# Patient Record
Sex: Female | Born: 1990 | Race: Black or African American | Hispanic: No | Marital: Single | Smoking: Never smoker
Health system: Southern US, Community
[De-identification: ages and names within clinical notes are randomized; demographics above are authoritative.]

## PROBLEM LIST (undated history)

## (undated) ENCOUNTER — Inpatient Hospital Stay (HOSPITAL_COMMUNITY): Payer: Self-pay

## (undated) DIAGNOSIS — R87629 Unspecified abnormal cytological findings in specimens from vagina: Secondary | ICD-10-CM

## (undated) DIAGNOSIS — F419 Anxiety disorder, unspecified: Secondary | ICD-10-CM

## (undated) DIAGNOSIS — F32A Depression, unspecified: Secondary | ICD-10-CM

## (undated) DIAGNOSIS — F329 Major depressive disorder, single episode, unspecified: Secondary | ICD-10-CM

## (undated) DIAGNOSIS — B009 Herpesviral infection, unspecified: Secondary | ICD-10-CM

## (undated) DIAGNOSIS — F4310 Post-traumatic stress disorder, unspecified: Secondary | ICD-10-CM

## (undated) HISTORY — DX: Major depressive disorder, single episode, unspecified: F32.9

## (undated) HISTORY — PX: NO PAST SURGERIES: SHX2092

## (undated) HISTORY — DX: Herpesviral infection, unspecified: B00.9

## (undated) HISTORY — DX: Anxiety disorder, unspecified: F41.9

## (undated) HISTORY — DX: Depression, unspecified: F32.A

## (undated) HISTORY — DX: Post-traumatic stress disorder, unspecified: F43.10

## (undated) HISTORY — DX: Unspecified abnormal cytological findings in specimens from vagina: R87.629

---

## 2017-11-09 NOTE — L&D Delivery Note (Addendum)
Patient: Brooke Briggs MRN: [id number]  GBS status: positive, IAP given: penicillin  Patient is a 27 y.o. now G2P2002 s/p NSVD at 2461w3d, who was admitted for PROM. SROM 6h 388m prior to delivery with clear fluid.   Delivery Note At 7:13 PM a viable female was delivered via Vaginal, Spontaneous (Presentation: OA).  APGAR: pending in delivery summary, infant vigorous at delivery; weight pending.   Placenta status: intact. Cord: three vessel with the following complications: none.  Anesthesia: IV pain medication Episiotomy: None Lacerations: None Suture Repair: NA Est. Blood Loss (mL): 504  Mom to postpartum.  Baby to Couplet care / Skin to Skin.  Brooke Briggs 10/25/2018, 7:38 PM  Head delivered OA. A nuchal cord present and was not reduced prior to delivery. Shoulder and body delivered in usual fashion. Infant with spontaneous cry, placed on mother's abdomen, dried and bulb suctioned. Cord clamped x 2 after 1-minute delay, and cut by family member. Cord blood drawn. Placenta delivered spontaneously with gentle cord traction. Fundus firm with massage and Pitocin. Perineum inspected and found to have no laceration, which was found to be hemostatic.  Attestation: I have seen this patient and agree with the resident's documentation. I have examined them separately, and we have discussed the plan of care.  Brooke DeerLaurel S. Earlene PlaterWallace, DO OB/GYN Fellow

## 2018-05-19 ENCOUNTER — Encounter: Payer: Medicaid Other | Admitting: Obstetrics & Gynecology

## 2018-05-30 ENCOUNTER — Ambulatory Visit (INDEPENDENT_AMBULATORY_CARE_PROVIDER_SITE_OTHER): Payer: Medicaid Other | Admitting: Obstetrics & Gynecology

## 2018-05-30 ENCOUNTER — Encounter: Payer: Self-pay | Admitting: Obstetrics & Gynecology

## 2018-05-30 DIAGNOSIS — Z3482 Encounter for supervision of other normal pregnancy, second trimester: Secondary | ICD-10-CM

## 2018-05-30 DIAGNOSIS — Z3480 Encounter for supervision of other normal pregnancy, unspecified trimester: Secondary | ICD-10-CM | POA: Insufficient documentation

## 2018-05-30 MED ORDER — PROMETHAZINE HCL 25 MG PO TABS: 25.0000 mg | ORAL_TABLET | Freq: Four times a day (QID) | ORAL | 2 refills | Status: DC | PRN

## 2018-05-30 NOTE — Addendum Note (Signed)
Addended by: Granville LewisLARK, Drelyn Pistilli L on: 05/30/2018 02:34 PM   Modules accepted: Orders

## 2018-05-30 NOTE — Progress Notes (Signed)
  Subjective:    Brooke Briggs is being seen today for her first obstetrical visit.  This is a planned pregnancy. She is at 4583w2d gestation. Her obstetrical history is significant for none. Relationship with FOB: not married, not living together at this time. Patient does intend to breast feed. Pregnancy history fully reviewed.  Patient reports vomiting.  Review of Systems:   Review of Systems  Objective:     BP 130/83   Pulse 80   Ht 5\' 4"  (1.626 m)   Wt 139 lb (63 kg)   LMP 01/29/2018   BMI 23.86 kg/m  Physical Exam  Exam  Breathing, conversing, and ambulating normally Heart- rrr Lungs- CTAB Abd- benign, gravid FHR- 157    Assessment:    Pregnancy: G2P1001 Patient Active Problem List   Diagnosis Date Noted  . Supervision of other normal pregnancy, antepartum 05/30/2018       Plan:     Initial labs drawn. Prenatal vitamins. Problem list reviewed and updated. AFP today Role of ultrasound in pregnancy discussed; fetal survey: ordered.  Follow up in 4 weeks.   Phenergan prn Allie BossierMyra C Ether Goebel 05/30/2018

## 2018-05-31 LAB — ALPHA FETOPROTEIN, MATERNAL
AFP MOM: 1.15
AFP, SERUM: 54.8 ng/mL
CALC'D GESTATIONAL AGE: 17.3 wk
MATERNAL WT: 132 [lb_av]
Risk for ONTD: <1
Twins-AFP: 1

## 2018-06-07 ENCOUNTER — Encounter (HOSPITAL_COMMUNITY): Payer: Self-pay

## 2018-06-14 ENCOUNTER — Ambulatory Visit (HOSPITAL_COMMUNITY)
Admission: RE | Admit: 2018-06-14 | Discharge: 2018-06-14 | Disposition: A | Discharge status: Home / Self Care | Payer: Medicaid Other | Source: Ambulatory Visit | Attending: Obstetrics & Gynecology | Admitting: Obstetrics & Gynecology

## 2018-06-14 ENCOUNTER — Other Ambulatory Visit: Payer: Self-pay | Admitting: Obstetrics & Gynecology

## 2018-06-14 ENCOUNTER — Other Ambulatory Visit (HOSPITAL_COMMUNITY): Payer: Self-pay | Admitting: *Deleted

## 2018-06-14 DIAGNOSIS — IMO0002 Reserved for concepts with insufficient information to code with codable children: Secondary | ICD-10-CM

## 2018-06-14 DIAGNOSIS — Z3A19 19 weeks gestation of pregnancy: Secondary | ICD-10-CM | POA: Diagnosis not present

## 2018-06-14 DIAGNOSIS — Q210 Ventricular septal defect: Secondary | ICD-10-CM

## 2018-06-14 DIAGNOSIS — O350XX0 Maternal care for (suspected) central nervous system malformation in fetus, not applicable or unspecified: Secondary | ICD-10-CM

## 2018-06-14 DIAGNOSIS — Z363 Encounter for antenatal screening for malformations: Secondary | ICD-10-CM

## 2018-06-14 DIAGNOSIS — Z3480 Encounter for supervision of other normal pregnancy, unspecified trimester: Secondary | ICD-10-CM

## 2018-06-14 DIAGNOSIS — Z362 Encounter for other antenatal screening follow-up: Secondary | ICD-10-CM

## 2018-06-14 DIAGNOSIS — O3506X0 Maternal care for (suspected) central nervous system malformation or damage in fetus, hydrocephaly, not applicable or unspecified: Secondary | ICD-10-CM

## 2018-06-27 ENCOUNTER — Ambulatory Visit (INDEPENDENT_AMBULATORY_CARE_PROVIDER_SITE_OTHER): Payer: Medicaid Other | Admitting: Obstetrics & Gynecology

## 2018-06-27 VITALS — BP 120/88 | HR 96 | Wt 146.0 lb

## 2018-06-27 DIAGNOSIS — Z3480 Encounter for supervision of other normal pregnancy, unspecified trimester: Secondary | ICD-10-CM

## 2018-06-27 NOTE — Progress Notes (Signed)
   PRENATAL VISIT NOTE  Subjective:  Brooke Briggs is a 27 y.o. G2P1001 at 7527w2d being seen today for ongoing prenatal care.  She is currently monitored for the following issues for this low-risk pregnancy and has Supervision of other normal pregnancy, antepartum on their problem list.  Patient reports no complaints.  Contractions: Not present. Vag. Bleeding: None.  Movement: Present. Denies leaking of fluid.   The following portions of the patient's history were reviewed and updated as appropriate: allergies, current medications, past family history, past medical history, past social history, past surgical history and problem list. Problem list updated.  Objective:   Vitals:   06/27/18 1530  BP: 120/88  Pulse: 96  Weight: 146 lb (66.2 kg)    Fetal Status: Fetal Heart Rate (bpm): 148 Fundal Height: 22 cm Movement: Present     General:  Alert, oriented and cooperative. Patient is in no acute distress.  Skin: Skin is warm and dry. No rash noted.   Cardiovascular: Normal heart rate noted  Respiratory: Normal respiratory effort, no problems with respiration noted  Abdomen: Soft, gravid, appropriate for gestational age.  Pain/Pressure: Absent     Pelvic: Cervical exam deferred        Extremities: Normal range of motion.  Edema: None  Mental Status: Normal mood and affect. Normal behavior. Normal judgment and thought content.   Assessment and Plan:  Pregnancy: G2P1001 at 6427w2d  1. Supervision of other normal pregnancy, antepartum - follow up MFM u/s next month  Preterm labor symptoms and general obstetric precautions including but not limited to vaginal bleeding, contractions, leaking of fluid and fetal movement were reviewed in detail with the patient. Please refer to After Visit Summary for other counseling recommendations.  Return in about 4 weeks (around 07/25/2018).  Future Appointments  Date Time Provider Department Center  07/12/2018 12:45 PM [facility] US 2 [facility]     Allie BossierMyra C Chennel Olivos, MD

## 2018-07-12 ENCOUNTER — Ambulatory Visit (HOSPITAL_COMMUNITY)
Admission: RE | Admit: 2018-07-12 | Discharge: 2018-07-12 | Disposition: A | Discharge status: Home / Self Care | Payer: Medicaid Other | Source: Ambulatory Visit | Attending: Obstetrics & Gynecology | Admitting: Obstetrics & Gynecology

## 2018-07-12 DIAGNOSIS — Z3A23 23 weeks gestation of pregnancy: Secondary | ICD-10-CM | POA: Diagnosis not present

## 2018-07-12 DIAGNOSIS — O359XX0 Maternal care for (suspected) fetal abnormality and damage, unspecified, not applicable or unspecified: Secondary | ICD-10-CM

## 2018-07-12 DIAGNOSIS — Z362 Encounter for other antenatal screening follow-up: Secondary | ICD-10-CM

## 2018-07-25 ENCOUNTER — Other Ambulatory Visit (HOSPITAL_COMMUNITY)
Admission: RE | Admit: 2018-07-25 | Discharge: 2018-07-25 | Disposition: A | Discharge status: Home / Self Care | Payer: Medicaid Other | Source: Ambulatory Visit | Attending: Obstetrics & Gynecology | Admitting: Obstetrics & Gynecology

## 2018-07-25 ENCOUNTER — Ambulatory Visit (INDEPENDENT_AMBULATORY_CARE_PROVIDER_SITE_OTHER): Payer: Medicaid Other | Admitting: Obstetrics & Gynecology

## 2018-07-25 VITALS — BP 130/79 | HR 92 | Wt 152.0 lb

## 2018-07-25 DIAGNOSIS — O26899 Other specified pregnancy related conditions, unspecified trimester: Secondary | ICD-10-CM

## 2018-07-25 DIAGNOSIS — N898 Other specified noninflammatory disorders of vagina: Secondary | ICD-10-CM | POA: Diagnosis not present

## 2018-07-25 DIAGNOSIS — Z3A25 25 weeks gestation of pregnancy: Secondary | ICD-10-CM | POA: Diagnosis not present

## 2018-07-25 DIAGNOSIS — Z3482 Encounter for supervision of other normal pregnancy, second trimester: Secondary | ICD-10-CM

## 2018-07-25 DIAGNOSIS — O26892 Other specified pregnancy related conditions, second trimester: Secondary | ICD-10-CM | POA: Insufficient documentation

## 2018-07-25 DIAGNOSIS — Z3480 Encounter for supervision of other normal pregnancy, unspecified trimester: Secondary | ICD-10-CM

## 2018-07-25 LAB — POCT URINALYSIS DIPSTICK
Bilirubin, UA: NEGATIVE
Glucose, UA: NEGATIVE
Ketones, UA: NEGATIVE
NITRITE UA: NEGATIVE
PH UA: 7.5 (ref 5.0–8.0)
PROTEIN UA: POSITIVE — AB
Spec Grav, UA: 1.01 (ref 1.010–1.025)
UROBILINOGEN UA: NEGATIVE U/dL — AB

## 2018-07-25 NOTE — Progress Notes (Signed)
Error

## 2018-07-25 NOTE — Progress Notes (Signed)
   PRENATAL VISIT NOTE  Subjective:  Brooke Briggs is a 27 y.o. G2P1001 at 3670w2d being seen today for ongoing prenatal care.  She is currently monitored for the following issues for this low-risk pregnancy and has Supervision of other normal pregnancy, antepartum on their problem list.  Patient reports vaginal irritation.  Contractions: Not present. Vag. Bleeding: None.  Movement: Present. Denies leaking of fluid.   The following portions of the patient's history were reviewed and updated as appropriate: allergies, current medications, past family history, past medical history, past social history, past surgical history and problem list. Problem list updated.  Objective:   Vitals:   07/25/18 1511  BP: 130/79  Pulse: 92  Weight: 152 lb (68.9 kg)    Fetal Status: Fetal Heart Rate (bpm): 153 Fundal Height: 26 cm Movement: Present     General:  Alert, oriented and cooperative. Patient is in no acute distress.  Skin: Skin is warm and dry. No rash noted.   Cardiovascular: Normal heart rate noted  Respiratory: Normal respiratory effort, no problems with respiration noted  Abdomen: Soft, gravid, appropriate for gestational age.  Pain/Pressure: Absent     Pelvic: Cervical exam deferred      , speculum exam reveals a normal discharge  Extremities: Normal range of motion.  Edema: None  Mental Status: Normal mood and affect. Normal behavior. Normal judgment and thought content.   Assessment and Plan:  Pregnancy: G2P1001 at 3170w2d  1. Supervision of other normal pregnancy, antepartum   2. Vaginal discharge during pregnancy, antepartum  - Cervicovaginal ancillary only - POCT Urinalysis Dipstick - Urine Culture  Preterm labor symptoms and general obstetric precautions including but not limited to vaginal bleeding, contractions, leaking of fluid and fetal movement were reviewed in detail with the patient. Please refer to After Visit Summary for other counseling recommendations.  No  follow-ups on file.  No future appointments.  Allie BossierMyra C Tyheim Vanalstyne, MD

## 2018-07-26 ENCOUNTER — Other Ambulatory Visit: Payer: Self-pay | Admitting: Obstetrics & Gynecology

## 2018-07-26 LAB — CERVICOVAGINAL ANCILLARY ONLY
Bacterial vaginitis: NEGATIVE
CANDIDA VAGINITIS: NEGATIVE

## 2018-07-26 MED ORDER — NITROFURANTOIN MONOHYD MACRO 100 MG PO CAPS: 100.0000 mg | ORAL_CAPSULE | Freq: Two times a day (BID) | ORAL | 1 refills | Status: DC

## 2018-07-26 NOTE — Progress Notes (Signed)
macrobid called in for probable UTI

## 2018-07-28 LAB — URINE CULTURE

## 2018-08-01 ENCOUNTER — Other Ambulatory Visit: Payer: Self-pay | Admitting: Obstetrics & Gynecology

## 2018-08-01 MED ORDER — SULFAMETHOXAZOLE-TRIMETHOPRIM 800-160 MG PO TABS: 1.0000 | ORAL_TABLET | Freq: Two times a day (BID) | ORAL | 0 refills | Status: DC

## 2018-08-01 NOTE — Progress Notes (Signed)
Bactrim called in for UTI Patient notified via Mychart

## 2018-08-15 ENCOUNTER — Ambulatory Visit (INDEPENDENT_AMBULATORY_CARE_PROVIDER_SITE_OTHER): Payer: Medicaid Other | Admitting: Obstetrics & Gynecology

## 2018-08-15 VITALS — BP 121/78 | HR 75 | Wt 155.0 lb

## 2018-08-15 DIAGNOSIS — Z3480 Encounter for supervision of other normal pregnancy, unspecified trimester: Secondary | ICD-10-CM

## 2018-08-15 DIAGNOSIS — Z3483 Encounter for supervision of other normal pregnancy, third trimester: Secondary | ICD-10-CM

## 2018-08-15 DIAGNOSIS — Z23 Encounter for immunization: Secondary | ICD-10-CM

## 2018-08-15 NOTE — Progress Notes (Signed)
   PRENATAL VISIT NOTE  Subjective:  Brooke Briggs is a 27 y.o. G2P1001 at [redacted]w[redacted]d being seen today for ongoing prenatal care.  She is currently monitored for the following issues for this low-risk pregnancy and has Supervision of other normal pregnancy, antepartum on their problem list.  Patient reports no complaints.  Contractions: Not present. Vag. Bleeding: None.  Movement: Present. Denies leaking of fluid.   The following portions of the patient's history were reviewed and updated as appropriate: allergies, current medications, past family history, past medical history, past social history, past surgical history and problem list. Problem list updated.  Objective:   Vitals:   08/15/18 1008  BP: 121/78  Pulse: 75  Weight: 155 lb (70.3 kg)    Fetal Status: Fetal Heart Rate (bpm): 151   Movement: Present     General:  Alert, oriented and cooperative. Patient is in no acute distress.  Skin: Skin is warm and dry. No rash noted.   Cardiovascular: Normal heart rate noted  Respiratory: Normal respiratory effort, no problems with respiration noted  Abdomen: Soft, gravid, appropriate for gestational age.  Pain/Pressure: Absent     Pelvic: Cervical exam deferred        Extremities: Normal range of motion.  Edema: None  Mental Status: Normal mood and affect. Normal behavior. Normal judgment and thought content.   Assessment and Plan:  Pregnancy: G2P1001 at [redacted]w[redacted]d  1. Supervision of other normal pregnancy, antepartum  - 2Hr GTT w/ 1 Hr Carpenter 75 g - HIV antibody (with reflex) - CBC - RPR - Tdap vaccine greater than or equal to 7yo IM  2. Needs flu shot  - Flu Vaccine QUAD 36+ mos IM  Preterm labor symptoms and general obstetric precautions including but not limited to vaginal bleeding, contractions, leaking of fluid and fetal movement were reviewed in detail with the patient. Please refer to After Visit Summary for other counseling recommendations.  No follow-ups on  file.  Future Appointments  Date Time Provider Department Center  08/30/2018  3:45 PM Rasch, Harolyn Rutherford, NP [facility]    Allie Bossier, MD

## 2018-08-16 LAB — CBC
HCT: 33.5 % — ABNORMAL LOW (ref 35.0–45.0)
HEMOGLOBIN: 11.2 g/dL — AB (ref 11.7–15.5)
MCH: 27.9 pg (ref 27.0–33.0)
MCHC: 33.4 g/dL (ref 32.0–36.0)
MCV: 83.5 fL (ref 80.0–100.0)
MPV: 12.1 fL (ref 7.5–12.5)
Platelets: 310 10*3/uL (ref 140–400)
RBC: 4.01 10*6/uL (ref 3.80–5.10)
RDW: 12.8 % (ref 11.0–15.0)
WBC: 13.7 10*3/uL — ABNORMAL HIGH (ref 3.8–10.8)

## 2018-08-16 LAB — 2HR GTT W 1 HR, CARPENTER, 75 G
GLUCOSE, FASTING, GEST: 72 mg/dL (ref 65–91)
Glucose, 1 Hr, Gest: 136 mg/dL (ref 65–179)
Glucose, 2 Hr, Gest: 97 mg/dL (ref 65–152)

## 2018-08-16 LAB — HIV ANTIBODY (ROUTINE TESTING W REFLEX): HIV: NONREACTIVE

## 2018-08-16 LAB — RPR: RPR: NONREACTIVE

## 2018-08-30 ENCOUNTER — Ambulatory Visit (INDEPENDENT_AMBULATORY_CARE_PROVIDER_SITE_OTHER): Payer: Medicaid Other | Admitting: Obstetrics and Gynecology

## 2018-08-30 DIAGNOSIS — Z3480 Encounter for supervision of other normal pregnancy, unspecified trimester: Secondary | ICD-10-CM

## 2018-08-30 NOTE — Progress Notes (Signed)
   PRENATAL VISIT NOTE  Subjective:  Brooke Briggs is a 27 y.o. G2P1001 at [redacted]w[redacted]d being seen today for ongoing prenatal care.  She is currently monitored for the following issues for this low-risk pregnancy and has Supervision of other normal pregnancy, antepartum on their problem list.  Patient reports no complaints.  Contractions: Not present. Vag. Bleeding: None.  Movement: Present. Denies leaking of fluid.   The following portions of the patient's history were reviewed and updated as appropriate: allergies, current medications, past family history, past medical history, past social history, past surgical history and problem list. Problem list updated.  Objective:   Vitals:   08/30/18 1547  BP: 119/76  Pulse: (!) 120  Weight: 157 lb (71.2 kg)    Fetal Status: Fetal Heart Rate (bpm): 156   Movement: Present     General:  Alert, oriented and cooperative. Patient is in no acute distress.  Skin: Skin is warm and dry. No rash noted.   Cardiovascular: Normal heart rate noted  Respiratory: Normal respiratory effort, no problems with respiration noted  Abdomen: Soft, gravid, appropriate for gestational age.  Pain/Pressure: Absent     Pelvic: Cervical exam deferred        Extremities: Normal range of motion.  Edema: None  Mental Status: Normal mood and affect. Normal behavior. Normal judgment and thought content.   Assessment and Plan:  Pregnancy: G2P1001 at [redacted]w[redacted]d  1. Supervision of other normal pregnancy, antepartum  - Doing well, 2 hour GTT normal   There are no diagnoses linked to this encounter. Preterm labor symptoms and general obstetric precautions including but not limited to vaginal bleeding, contractions, leaking of fluid and fetal movement were reviewed in detail with the patient. Please refer to After Visit Summary for other counseling recommendations.  Return in about 2 weeks (around 09/13/2018).  Future Appointments  Date Time Provider Department Center  09/13/2018   3:45 PM Leftwich-Kirby, Wilmer Floor, CNM [facility]    Venia Carbon, NP

## 2018-08-30 NOTE — Patient Instructions (Signed)

## 2018-09-13 ENCOUNTER — Ambulatory Visit (INDEPENDENT_AMBULATORY_CARE_PROVIDER_SITE_OTHER): Payer: Medicaid Other | Admitting: Advanced Practice Midwife

## 2018-09-13 VITALS — BP 115/74 | HR 110 | Wt 166.0 lb

## 2018-09-13 DIAGNOSIS — B009 Herpesviral infection, unspecified: Secondary | ICD-10-CM

## 2018-09-13 DIAGNOSIS — F329 Major depressive disorder, single episode, unspecified: Secondary | ICD-10-CM

## 2018-09-13 DIAGNOSIS — O98513 Other viral diseases complicating pregnancy, third trimester: Secondary | ICD-10-CM

## 2018-09-13 DIAGNOSIS — O99343 Other mental disorders complicating pregnancy, third trimester: Secondary | ICD-10-CM

## 2018-09-13 DIAGNOSIS — Z3480 Encounter for supervision of other normal pregnancy, unspecified trimester: Secondary | ICD-10-CM

## 2018-09-13 MED ORDER — VALACYCLOVIR HCL 500 MG PO TABS: 500.0000 mg | ORAL_TABLET | Freq: Two times a day (BID) | ORAL | 2 refills | Status: DC

## 2018-09-13 MED ORDER — SERTRALINE HCL 50 MG PO TABS: 50.0000 mg | ORAL_TABLET | Freq: Every day | ORAL | 3 refills | Status: DC

## 2018-09-13 NOTE — Progress Notes (Signed)
   PRENATAL VISIT NOTE  Subjective:  Brooke Briggs is a 27 y.o. G2P1001 at [redacted]w[redacted]d being seen today for ongoing prenatal care.  She is currently monitored for the following issues for this low-risk pregnancy and has Supervision of other normal pregnancy, antepartum on their problem list.  Patient reports occasional contractions and concerns about recurrent HSV and delivery, and concerns about depression and medications.  Contractions: Not present. Vag. Bleeding: None.  Movement: Present. Denies leaking of fluid.   The following portions of the patient's history were reviewed and updated as appropriate: allergies, current medications, past family history, past medical history, past social history, past surgical history and problem list. Problem list updated.  Objective:   Vitals:   09/13/18 1600  BP: 115/74  Pulse: (!) 110  Weight: 75.3 kg    Fetal Status: Fetal Heart Rate (bpm): 145   Movement: Present     General:  Alert, oriented and cooperative. Patient is in no acute distress.  Skin: Skin is warm and dry. No rash noted.   Cardiovascular: Normal heart rate noted  Respiratory: Normal respiratory effort, no problems with respiration noted  Abdomen: Soft, gravid, appropriate for gestational age.  Pain/Pressure: Absent     Pelvic: Cervical exam deferred        Extremities: Normal range of motion.  Edema: None  Mental Status: Normal mood and affect. Normal behavior. Normal judgment and thought content.   Assessment and Plan:  Pregnancy: G2P1001 at [redacted]w[redacted]d  1. Supervision of other normal pregnancy, antepartum --Anticipatory guidance about next visits/weeks of pregnancy given.  2. 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 Low risk, pt reports significant improvement in depression since starting med.  Will continue and take PP. She had PP depression with her son, not on medications so hopes this will help. --Recommend  counseling, discussed referral to [facility] here at [facility].  No referral made today. --Renewed Rx for Zoloft started by previous OB provider in [place]. - sertraline (ZOLOFT) 50 MG tablet; Take 1 tablet (50 mg total) by mouth daily.  Dispense: 30 tablet; Refill: 3  3. HSV-2 infection complicating pregnancy, third trimester --Discussed low risks of transmission in recurrent HSV with no outbreak at time of delivery (2 in 10,000).  Valtrex reduces this risk and is usually recommended at 36 weeks for prevention.  Pt desires to begin this dose now.  Rx sent. - valACYclovir (VALTREX) 500 MG tablet; Take 1 tablet (500 mg total) by mouth 2 (two) times daily.  Dispense: 60 tablet; Refill: 2  Preterm labor symptoms and general obstetric precautions including but not limited to vaginal bleeding, contractions, leaking of fluid and fetal movement were reviewed in detail with the patient. Please refer to After Visit Summary for other counseling recommendations.  Return in about 2 weeks (around 09/27/2018).  No future appointments.  Sharen Counter, CNM

## 2018-09-13 NOTE — Patient Instructions (Signed)
Third Trimester of Pregnancy The third trimester is from week 28 through week 40 (months 7 through 9). The third trimester is a time when the unborn baby (fetus) is growing rapidly. At the end of the ninth month, the fetus is about 20 inches in length and weighs 6-10 pounds. Body changes during your third trimester Your body will continue to go through many changes during pregnancy. The changes vary from woman to woman. During the third trimester:  Your weight will continue to increase. You can expect to gain 25-35 pounds (11-16 kg) by the end of the pregnancy.  You may begin to get stretch marks on your hips, abdomen, and breasts.  You may urinate more often because the fetus is moving lower into your pelvis and pressing on your bladder.  You may develop or continue to have heartburn. This is caused by increased hormones that slow down muscles in the digestive tract.  You may develop or continue to have constipation because increased hormones slow digestion and cause the muscles that push waste through your intestines to relax.  You may develop hemorrhoids. These are swollen veins (varicose veins) in the rectum that can itch or be painful.  You may develop swollen, bulging veins (varicose veins) in your legs.  You may have increased body aches in the pelvis, back, or thighs. This is due to weight gain and increased hormones that are relaxing your joints.  You may have changes in your hair. These can include thickening of your hair, rapid growth, and changes in texture. Some women also have hair loss during or after pregnancy, or hair that feels dry or thin. Your hair will most likely return to normal after your baby is born.  Your breasts will continue to grow and they will continue to become tender. A yellow fluid (colostrum) may leak from your breasts. This is the first milk you are producing for your baby.  Your belly button may stick out.  You may notice more swelling in your hands,  face, or ankles.  You may have increased tingling or numbness in your hands, arms, and legs. The skin on your belly may also feel numb.  You may feel short of breath because of your expanding uterus.  You may have more problems sleeping. This can be caused by the size of your belly, increased need to urinate, and an increase in your body's metabolism.  You may notice the fetus "dropping," or moving lower in your abdomen (lightening).  You may have increased vaginal discharge.  You may notice your joints feel loose and you may have pain around your pelvic bone.  What to expect at prenatal visits You will have prenatal exams every 2 weeks until week 36. Then you will have weekly prenatal exams. During a routine prenatal visit:  You will be weighed to make sure you and the baby are growing normally.  Your blood pressure will be taken.  Your abdomen will be measured to track your baby's growth.  The fetal heartbeat will be listened to.  Any test results from the previous visit will be discussed.  You may have a cervical check near your due date to see if your cervix has softened or thinned (effaced).  You will be tested for Group B streptococcus. This happens between 35 and 37 weeks.  Your health care provider may ask you:  What your birth plan is.  How you are feeling.  If you are feeling the baby move.  If you have had   any abnormal symptoms, such as leaking fluid, bleeding, severe headaches, or abdominal cramping.  If you are using any tobacco products, including cigarettes, chewing tobacco, and electronic cigarettes.  If you have any questions.  Other tests or screenings that may be performed during your third trimester include:  Blood tests that check for low iron levels (anemia).  Fetal testing to check the health, activity level, and growth of the fetus. Testing is done if you have certain medical conditions or if there are problems during the  pregnancy.  Nonstress test (NST). This test checks the health of your baby to make sure there are no signs of problems, such as the baby not getting enough oxygen. During this test, a belt is placed around your belly. The baby is made to move, and its heart rate is monitored during movement.  What is false labor? False labor is a condition in which you feel small, irregular tightenings of the muscles in the womb (contractions) that usually go away with rest, changing position, or drinking water. These are called Braxton Hicks contractions. Contractions may last for hours, days, or even weeks before true labor sets in. If contractions come at regular intervals, become more frequent, increase in intensity, or become painful, you should see your health care provider. What are the signs of labor?  Abdominal cramps.  Regular contractions that start at 10 minutes apart and become stronger and more frequent with time.  Contractions that start on the top of the uterus and spread down to the lower abdomen and back.  Increased pelvic pressure and dull back pain.  A watery or bloody mucus discharge that comes from the vagina.  Leaking of amniotic fluid. This is also known as your "water breaking." It could be a slow trickle or a gush. Let your health care provider know if it has a color or strange odor. If you have any of these signs, call your health care provider right away, even if it is before your due date. Follow these instructions at home: Medicines  Follow your health care provider's instructions regarding medicine use. Specific medicines may be either safe or unsafe to take during pregnancy.  Take a prenatal vitamin that contains at least 600 micrograms (mcg) of folic acid.  If you develop constipation, try taking a stool softener if your health care provider approves. Eating and drinking  Eat a balanced diet that includes fresh fruits and vegetables, whole grains, good sources of protein  such as meat, eggs, or tofu, and low-fat dairy. Your health care provider will help you determine the amount of weight gain that is right for you.  Avoid raw meat and uncooked cheese. These carry germs that can cause birth defects in the baby.  If you have low calcium intake from food, talk to your health care provider about whether you should take a daily calcium supplement.  Eat four or five small meals rather than three large meals a day.  Limit foods that are high in fat and processed sugars, such as fried and sweet foods.  To prevent constipation: ? Drink enough fluid to keep your urine clear or pale yellow. ? Eat foods that are high in fiber, such as fresh fruits and vegetables, whole grains, and beans. Activity  Exercise only as directed by your health care provider. Most women can continue their usual exercise routine during pregnancy. Try to exercise for 30 minutes at least 5 days a week. Stop exercising if you experience uterine contractions.  Avoid heavy   lifting.  Do not exercise in extreme heat or humidity, or at high altitudes.  Wear low-heel, comfortable shoes.  Practice good posture.  You may continue to have sex unless your health care provider tells you otherwise. Relieving pain and discomfort  Take frequent breaks and rest with your legs elevated if you have leg cramps or low back pain.  Take warm sitz baths to soothe any pain or discomfort caused by hemorrhoids. Use hemorrhoid cream if your health care provider approves.  Wear a good support bra to prevent discomfort from breast tenderness.  If you develop varicose veins: ? Wear support pantyhose or compression stockings as told by your healthcare provider. ? Elevate your feet for 15 minutes, 3-4 times a day. Prenatal care  Write down your questions. Take them to your prenatal visits.  Keep all your prenatal visits as told by your health care provider. This is important. Safety  Wear your seat belt at  all times when driving.  Make a list of emergency phone numbers, including numbers for family, friends, the hospital, and police and fire departments. General instructions  Avoid cat litter boxes and soil used by cats. These carry germs that can cause birth defects in the baby. If you have a cat, ask someone to clean the litter box for you.  Do not travel far distances unless it is absolutely necessary and only with the approval of your health care provider.  Do not use hot tubs, steam rooms, or saunas.  Do not drink alcohol.  Do not use any products that contain nicotine or tobacco, such as cigarettes and e-cigarettes. If you need help quitting, ask your health care provider.  Do not use any medicinal herbs or unprescribed drugs. These chemicals affect the formation and growth of the baby.  Do not douche or use tampons or scented sanitary pads.  Do not cross your legs for long periods of time.  To prepare for the arrival of your baby: ? Take prenatal classes to understand, practice, and ask questions about labor and delivery. ? Make a trial run to the hospital. ? Visit the hospital and tour the maternity area. ? Arrange for maternity or paternity leave through employers. ? Arrange for family and friends to take care of pets while you are in the hospital. ? Purchase a rear-facing car seat and make sure you know how to install it in your car. ? Pack your hospital bag. ? Prepare the baby's nursery. Make sure to remove all pillows and stuffed animals from the baby's crib to prevent suffocation.  Visit your dentist if you have not gone during your pregnancy. Use a soft toothbrush to brush your teeth and be gentle when you floss. Contact a health care provider if:  You are unsure if you are in labor or if your water has broken.  You become dizzy.  You have mild pelvic cramps, pelvic pressure, or nagging pain in your abdominal area.  You have lower back pain.  You have persistent  nausea, vomiting, or diarrhea.  You have an unusual or bad smelling vaginal discharge.  You have pain when you urinate. Get help right away if:  Your water breaks before 37 weeks.  You have regular contractions less than 5 minutes apart before 37 weeks.  You have a fever.  You are leaking fluid from your vagina.  You have spotting or bleeding from your vagina.  You have severe abdominal pain or cramping.  You have rapid weight loss or weight gain.    You have shortness of breath with chest pain.  You notice sudden or extreme swelling of your face, hands, ankles, feet, or legs.  Your baby makes fewer than 10 movements in 2 hours.  You have severe headaches that do not go away when you take medicine.  You have vision changes. Summary  The third trimester is from week 28 through week 40, months 7 through 9. The third trimester is a time when the unborn baby (fetus) is growing rapidly.  During the third trimester, your discomfort may increase as you and your baby continue to gain weight. You may have abdominal, leg, and back pain, sleeping problems, and an increased need to urinate.  During the third trimester your breasts will keep growing and they will continue to become tender. A yellow fluid (colostrum) may leak from your breasts. This is the first milk you are producing for your baby.  False labor is a condition in which you feel small, irregular tightenings of the muscles in the womb (contractions) that eventually go away. These are called Braxton Hicks contractions. Contractions may last for hours, days, or even weeks before true labor sets in.  Signs of labor can include: abdominal cramps; regular contractions that start at 10 minutes apart and become stronger and more frequent with time; watery or bloody mucus discharge that comes from the vagina; increased pelvic pressure and dull back pain; and leaking of amniotic fluid. This information is not intended to replace advice  given to you by your health care provider. Make sure you discuss any questions you have with your health care provider. Document Released: 10/20/2001 Document Revised: 04/02/2016 Document Reviewed: 12/27/2012 Elsevier Interactive Patient Education  2017 [employer]  

## 2018-09-27 ENCOUNTER — Ambulatory Visit (INDEPENDENT_AMBULATORY_CARE_PROVIDER_SITE_OTHER): Payer: Medicaid Other | Admitting: Advanced Practice Midwife

## 2018-09-27 VITALS — BP 120/73 | HR 115 | Wt 168.0 lb

## 2018-09-27 DIAGNOSIS — O98513 Other viral diseases complicating pregnancy, third trimester: Secondary | ICD-10-CM

## 2018-09-27 DIAGNOSIS — O99343 Other mental disorders complicating pregnancy, third trimester: Secondary | ICD-10-CM

## 2018-09-27 DIAGNOSIS — O368130 Decreased fetal movements, third trimester, not applicable or unspecified: Secondary | ICD-10-CM

## 2018-09-27 DIAGNOSIS — F32A Depression, unspecified: Secondary | ICD-10-CM

## 2018-09-27 DIAGNOSIS — Z3A34 34 weeks gestation of pregnancy: Secondary | ICD-10-CM

## 2018-09-27 DIAGNOSIS — F329 Major depressive disorder, single episode, unspecified: Secondary | ICD-10-CM

## 2018-09-27 DIAGNOSIS — Z3689 Encounter for other specified antenatal screening: Secondary | ICD-10-CM

## 2018-09-27 DIAGNOSIS — B009 Herpesviral infection, unspecified: Secondary | ICD-10-CM

## 2018-09-27 NOTE — Patient Instructions (Signed)

## 2018-09-27 NOTE — Progress Notes (Signed)
Pt c/o headaches and occasional visual changes

## 2018-09-27 NOTE — Progress Notes (Signed)
   PRENATAL VISIT NOTE  Subjective:  Brooke Briggs is a 27 y.o. G2P1001 at 349w3d being seen today for ongoing prenatal care.  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  Patient reports decreased fetal movemen;t.  Contractions: Not present. Vag. Bleeding: None.  Movement: (!) Decreased. Denies leaking of fluid.   The following portions of the patient's history were reviewed and updated as appropriate: allergies, current medications, past family history, past medical history, past social history, past surgical history and problem list. Problem list updated.  Objective:   Vitals:   09/27/18 1601  BP: 120/73  Pulse: (!) 115  Weight: 76.2 kg    Fetal Status:     Movement: (!) Decreased     General:  Alert, oriented and cooperative. Patient is in no acute distress.  Skin: Skin is warm and dry. No rash noted.   Cardiovascular: Normal heart rate noted  Respiratory: Normal respiratory effort, no problems with respiration noted  Abdomen: Soft, gravid, appropriate for gestational age.  Pain/Pressure: Absent     Pelvic: Cervical exam deferred        Extremities: Normal range of motion.  Edema: None  Mental Status: Normal mood and affect. Normal behavior. Normal judgment and thought content.   Assessment and Plan:  Pregnancy: G2P1001 at 3949w3d  1. Decreased fetal movements in third trimester, single or unspecified fetus --Pt not feeling as much as usual today.   --NST reactive and pt feeling more movement in office. --Kick/movement counting reviewed --Pt to increase PO fluids, especially if concerned about movement. Pt reports drinking Sprite only today, encouraged more water.  2. NST (non-stress test) reactive  3. Depression affecting pregnancy in  third trimester, antepartum --On Zoloft 50 mg daily, doing well.  4. HSV-2 infection complicating pregnancy, third trimester --On suppression therapy, no s/sx of outbreak.  Preterm labor symptoms and general obstetric precautions including but not limited to vaginal bleeding, contractions, leaking of fluid and fetal movement were reviewed in detail with the patient. Please refer to After Visit Summary for other counseling recommendations.  No follow-ups on file.  No future appointments.  Sharen CounterLisa Leftwich-Kirby, CNM

## 2018-10-11 ENCOUNTER — Other Ambulatory Visit (HOSPITAL_COMMUNITY)
Admission: RE | Admit: 2018-10-11 | Discharge: 2018-10-11 | Disposition: A | Discharge status: Home / Self Care | Payer: Medicaid Other | Source: Ambulatory Visit | Attending: Advanced Practice Midwife | Admitting: Advanced Practice Midwife

## 2018-10-11 ENCOUNTER — Ambulatory Visit (INDEPENDENT_AMBULATORY_CARE_PROVIDER_SITE_OTHER): Payer: Medicaid Other | Admitting: Advanced Practice Midwife

## 2018-10-11 VITALS — BP 117/76 | HR 106 | Wt 166.0 lb

## 2018-10-11 DIAGNOSIS — O26893 Other specified pregnancy related conditions, third trimester: Secondary | ICD-10-CM | POA: Insufficient documentation

## 2018-10-11 DIAGNOSIS — N949 Unspecified condition associated with female genital organs and menstrual cycle: Secondary | ICD-10-CM

## 2018-10-11 DIAGNOSIS — F329 Major depressive disorder, single episode, unspecified: Secondary | ICD-10-CM

## 2018-10-11 DIAGNOSIS — Z3480 Encounter for supervision of other normal pregnancy, unspecified trimester: Secondary | ICD-10-CM | POA: Diagnosis present

## 2018-10-11 DIAGNOSIS — B009 Herpesviral infection, unspecified: Secondary | ICD-10-CM

## 2018-10-11 DIAGNOSIS — R102 Pelvic and perineal pain: Secondary | ICD-10-CM

## 2018-10-11 DIAGNOSIS — N898 Other specified noninflammatory disorders of vagina: Secondary | ICD-10-CM | POA: Insufficient documentation

## 2018-10-11 DIAGNOSIS — O98513 Other viral diseases complicating pregnancy, third trimester: Secondary | ICD-10-CM

## 2018-10-11 DIAGNOSIS — O99343 Other mental disorders complicating pregnancy, third trimester: Secondary | ICD-10-CM

## 2018-10-11 DIAGNOSIS — O26853 Spotting complicating pregnancy, third trimester: Secondary | ICD-10-CM

## 2018-10-11 LAB — OB RESULTS CONSOLE GBS: GBS: POSITIVE

## 2018-10-11 LAB — OB RESULTS CONSOLE GC/CHLAMYDIA: Gonorrhea: NEGATIVE

## 2018-10-11 NOTE — Progress Notes (Signed)
   PRENATAL VISIT NOTE  Subjective:  Brooke Briggs is a 27 y.o. G2P1001 at 3331w3d being seen today for ongoing prenatal care.  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  Patient reports pelvic pressure, constant.  Contractions: Irritability. Vag. Bleeding: None, Scant.  Movement: Present. Denies leaking of fluid.   The following portions of the patient's history were reviewed and updated as appropriate: allergies, current medications, past family history, past medical history, past social history, past surgical history and problem list. Problem list updated.  Objective:   Vitals:   10/11/18 1048  BP: 117/76  Pulse: (!) 106  Weight: 75.3 kg    Fetal Status: Fetal Heart Rate (bpm): 135 Fundal Height: 35 cm Movement: Present  Presentation: Vertex  General:  Alert, oriented and cooperative. Patient is in no acute distress.  Skin: Skin is warm and dry. No rash noted.   Cardiovascular: Normal heart rate noted  Respiratory: Normal respiratory effort, no problems with respiration noted  Abdomen: Soft, gravid, appropriate for gestational age.  Pain/Pressure: Present     Pelvic: Cervical exam performed Dilation: 4 Effacement (%): 50 Station: -3  Extremities: Normal range of motion.  Edema: None  Mental Status: Normal mood and affect. Normal behavior. Normal judgment and thought content.   Assessment and Plan:  Pregnancy: G2P1001 at 3831w3d  1. 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  2. Supervision of other normal pregnancy, antepartum --Anticipatory guidance about next visits/weeks of pregnancy given. --Cervix 4 cm dilated, no s/sx of labor.  Reviewed reasons to come to hospital. - Cervicovaginal ancillary  only( [place]) - Culture, beta strep (group b only)  3. Vaginal discharge during pregnancy in third trimester  - Cervicovaginal ancillary only( [place])  4.Spotting in pregnancy, antepartum, third trimester --Spotting 3 days ago only, none since --No bleeding on exam today --Cervicovagininal ancillary only  5. HSV-2 infection complicating pregnancy, third trimester --Pt on suppressive therapy with Valtrex.  Term labor symptoms and general obstetric precautions including but not limited to vaginal bleeding, contractions, leaking of fluid and fetal movement were reviewed in detail with the patient. Please refer to After Visit Summary for other counseling recommendations.  Return in about 1 week (around 10/18/2018).  No future appointments.  Sharen CounterLisa Leftwich-Kirby, CNM

## 2018-10-11 NOTE — Patient Instructions (Signed)

## 2018-10-12 LAB — CERVICOVAGINAL ANCILLARY ONLY
Bacterial vaginitis: NEGATIVE
Candida vaginitis: NEGATIVE
Chlamydia: NEGATIVE
Neisseria Gonorrhea: NEGATIVE

## 2018-10-14 LAB — CULTURE, BETA STREP (GROUP B ONLY)
MICRO NUMBER:: 91446036
SPECIMEN QUALITY: ADEQUATE

## 2018-10-15 ENCOUNTER — Encounter: Payer: Self-pay | Admitting: Advanced Practice Midwife

## 2018-10-15 DIAGNOSIS — O99820 Streptococcus B carrier state complicating pregnancy: Secondary | ICD-10-CM | POA: Insufficient documentation

## 2018-10-18 ENCOUNTER — Ambulatory Visit (INDEPENDENT_AMBULATORY_CARE_PROVIDER_SITE_OTHER): Payer: Self-pay | Admitting: Certified Nurse Midwife

## 2018-10-18 VITALS — BP 121/81 | HR 99 | Wt 169.0 lb

## 2018-10-18 DIAGNOSIS — B009 Herpesviral infection, unspecified: Secondary | ICD-10-CM

## 2018-10-18 DIAGNOSIS — O99820 Streptococcus B carrier state complicating pregnancy: Secondary | ICD-10-CM

## 2018-10-18 DIAGNOSIS — Z3480 Encounter for supervision of other normal pregnancy, unspecified trimester: Secondary | ICD-10-CM

## 2018-10-18 DIAGNOSIS — O98513 Other viral diseases complicating pregnancy, third trimester: Secondary | ICD-10-CM

## 2018-10-18 DIAGNOSIS — Z3483 Encounter for supervision of other normal pregnancy, third trimester: Secondary | ICD-10-CM

## 2018-10-18 NOTE — Progress Notes (Signed)
Subjective:  Brooke Briggs is a 27 y.o. G2P1001 at 6059w3d being seen today for ongoing prenatal care.  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  Patient reports no complaints.  Contractions: Irritability. Vag. Bleeding: None.  Movement: Present. Denies leaking of fluid.   The following portions of the patient's history were reviewed and updated as appropriate: allergies, current medications, past family history, past medical history, past social history, past surgical history and problem list. Problem list updated.  Objective:   Vitals:   10/18/18 1102  BP: 121/81  Pulse: 99  Weight: 76.7 kg    Fetal Status: Fetal Heart Rate (bpm): 151 Fundal Height: 37 cm Movement: Present  Presentation: Vertex  General:  Alert, oriented and cooperative. Patient is in no acute distress.  Skin: Skin is warm and dry. No rash noted.   Cardiovascular: Normal heart rate noted  Respiratory: Normal respiratory effort, no problems with respiration noted  Abdomen: Soft, gravid, appropriate for gestational age. Pain/Pressure: Present     Pelvic: Vag. Bleeding: None Vag D/C Character: Thin   Cervical exam performed Dilation: 4 Effacement (%): 60 Station: -3  Extremities: Normal range of motion.  Edema: None  Mental Status: Normal mood and affect. Normal behavior. Normal judgment and thought content.   Urinalysis:      Assessment and Plan:  Pregnancy: G2P1001 at 6859w3d  1. Supervision of other normal pregnancy, antepartum  2. HSV-2 infection complicating pregnancy, third trimester - taking Valtrex, continue until delivery  3. GBS (group B Streptococcus carrier), +RV culture, currently pregnant - pt  informed - PCN in labor  Term labor symptoms and general obstetric precautions including but not limited to vaginal bleeding, contractions, leaking of fluid and fetal movement were reviewed in detail with the patient. Please refer to After Visit Summary for other counseling recommendations.  Return in about 1 week (around 10/25/2018).   Donette LarryBhambri, Roshunda Keir, CNM

## 2018-10-24 ENCOUNTER — Ambulatory Visit (INDEPENDENT_AMBULATORY_CARE_PROVIDER_SITE_OTHER): Payer: Self-pay | Admitting: Obstetrics & Gynecology

## 2018-10-24 VITALS — BP 127/85 | HR 124 | Wt 166.0 lb

## 2018-10-24 DIAGNOSIS — Z3483 Encounter for supervision of other normal pregnancy, third trimester: Secondary | ICD-10-CM

## 2018-10-24 DIAGNOSIS — Z3480 Encounter for supervision of other normal pregnancy, unspecified trimester: Secondary | ICD-10-CM

## 2018-10-24 NOTE — Progress Notes (Signed)
   PRENATAL VISIT NOTE  Subjective:  Brooke Briggs is a 27 y.o. G2P1001 at 3440w2d being seen today for ongoing prenatal care.  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  Patient reports pelvic pressure.  Contractions: Irregular. Vag. Bleeding: None.  Movement: Present. Denies leaking of fluid.   The following portions of the patient's history were reviewed and updated as appropriate: allergies, current medications, past family history, past medical history, past social history, past surgical history and problem list. Problem list updated.  Objective:   Vitals:   10/24/18 1513  BP: 127/85  Pulse: (!) 124  Weight: 166 lb (75.3 kg)    Fetal Status: Fetal Heart Rate (bpm): 147   Movement: Present     General:  Alert, oriented and cooperative. Patient is in no acute distress.  Skin: Skin is warm and dry. No rash noted.   Cardiovascular: Normal heart rate noted  Respiratory: Normal respiratory effort, no problems with respiration noted  Abdomen: Soft, gravid, appropriate for gestational age.  Pain/Pressure: Present     Pelvic: Cervical exam performed      5/60/still posterior.    Extremities: Normal range of motion.  Edema: None  Mental Status: Normal mood and affect. Normal behavior. Normal judgment and thought content.   Assessment and Plan:  Pregnancy: G2P1001 at 4340w2d with irregular contractions and pelvic pressure.  Cervical exam about 5 cm (started at 4.  There has been some effacement in past hour.  I suggest the patient go for a walk for an hour and see if contractions become more regular.  If she has vaginal bleeding decreased fetal movement or rupture of membranes or onset of  regular contractions that she should go to the maternity admissions unit.  NST is reactive at this time.  There are no diagnoses linked to this encounter. Term labor symptoms and general obstetric precautions including but not limited to vaginal bleeding, contractions, leaking of fluid and fetal movement were reviewed in detail with the patient. Please refer to After Visit Summary for other counseling recommendations.  No follow-ups on file.  No future appointments.  Elsie LincolnKelly Leggett, MD

## 2018-10-25 ENCOUNTER — Other Ambulatory Visit: Payer: Self-pay

## 2018-10-25 ENCOUNTER — Inpatient Hospital Stay (HOSPITAL_COMMUNITY)
Admission: AD | Admit: 2018-10-25 | Discharge: 2018-10-27 | DRG: 806 | Disposition: A | Discharge status: Home / Self Care | Payer: Medicaid Other | Attending: Family Medicine | Admitting: Family Medicine

## 2018-10-25 ENCOUNTER — Encounter (HOSPITAL_COMMUNITY): Payer: Self-pay

## 2018-10-25 ENCOUNTER — Encounter: Payer: Medicaid Other | Admitting: Certified Nurse Midwife

## 2018-10-25 DIAGNOSIS — O4202 Full-term premature rupture of membranes, onset of labor within 24 hours of rupture: Secondary | ICD-10-CM | POA: Diagnosis not present

## 2018-10-25 DIAGNOSIS — O98513 Other viral diseases complicating pregnancy, third trimester: Secondary | ICD-10-CM

## 2018-10-25 DIAGNOSIS — O99820 Streptococcus B carrier state complicating pregnancy: Secondary | ICD-10-CM

## 2018-10-25 DIAGNOSIS — O99344 Other mental disorders complicating childbirth: Secondary | ICD-10-CM | POA: Diagnosis present

## 2018-10-25 DIAGNOSIS — Z3480 Encounter for supervision of other normal pregnancy, unspecified trimester: Secondary | ICD-10-CM

## 2018-10-25 DIAGNOSIS — A6000 Herpesviral infection of urogenital system, unspecified: Secondary | ICD-10-CM | POA: Diagnosis present

## 2018-10-25 DIAGNOSIS — F329 Major depressive disorder, single episode, unspecified: Secondary | ICD-10-CM | POA: Diagnosis present

## 2018-10-25 DIAGNOSIS — O4292 Full-term premature rupture of membranes, unspecified as to length of time between rupture and onset of labor: Secondary | ICD-10-CM | POA: Diagnosis present

## 2018-10-25 DIAGNOSIS — O9832 Other infections with a predominantly sexual mode of transmission complicating childbirth: Secondary | ICD-10-CM | POA: Diagnosis present

## 2018-10-25 DIAGNOSIS — Z3A38 38 weeks gestation of pregnancy: Secondary | ICD-10-CM

## 2018-10-25 DIAGNOSIS — O99824 Streptococcus B carrier state complicating childbirth: Secondary | ICD-10-CM | POA: Diagnosis present

## 2018-10-25 DIAGNOSIS — O4290 Premature rupture of membranes, unspecified as to length of time between rupture and onset of labor, unspecified weeks of gestation: Secondary | ICD-10-CM | POA: Diagnosis present

## 2018-10-25 DIAGNOSIS — B009 Herpesviral infection, unspecified: Secondary | ICD-10-CM | POA: Diagnosis present

## 2018-10-25 DIAGNOSIS — F32A Depression, unspecified: Secondary | ICD-10-CM | POA: Diagnosis present

## 2018-10-25 DIAGNOSIS — O99343 Other mental disorders complicating pregnancy, third trimester: Secondary | ICD-10-CM

## 2018-10-25 LAB — CBC
HCT: 30.1 % — ABNORMAL LOW (ref 36.0–46.0)
Hemoglobin: 9.6 g/dL — ABNORMAL LOW (ref 12.0–15.0)
MCH: 27.1 pg (ref 26.0–34.0)
MCHC: 31.9 g/dL (ref 30.0–36.0)
MCV: 85 fL (ref 80.0–100.0)
Platelets: 426 10*3/uL — ABNORMAL HIGH (ref 150–400)
RBC: 3.54 MIL/uL — ABNORMAL LOW (ref 3.87–5.11)
RDW: 15 % (ref 11.5–15.5)
WBC: 16.8 10*3/uL — ABNORMAL HIGH (ref 4.0–10.5)
nRBC: 0 % (ref 0.0–0.2)

## 2018-10-25 LAB — TYPE AND SCREEN
ABO/RH(D): A POS
Antibody Screen: NEGATIVE

## 2018-10-25 LAB — POCT FERN TEST: POCT FERN TEST: POSITIVE

## 2018-10-25 LAB — ABO/RH: ABO/RH(D): A POS

## 2018-10-25 MED ORDER — LACTATED RINGERS IV SOLN
500.0000 mL | INTRAVENOUS | Status: DC | PRN
  Administered 2018-10-25: 500 mL via INTRAVENOUS

## 2018-10-25 MED ORDER — IBUPROFEN 600 MG PO TABS
600.0000 mg | ORAL_TABLET | Freq: Four times a day (QID) | ORAL | Status: DC
  Administered 2018-10-25 – 2018-10-27 (×6): 600 mg via ORAL
  Filled 2018-10-25 (×7): qty 1

## 2018-10-25 MED ORDER — BENZOCAINE-MENTHOL 20-0.5 % EX AERO: 1.0000 "application " | INHALATION_SPRAY | CUTANEOUS | Status: DC | PRN

## 2018-10-25 MED ORDER — LIDOCAINE HCL (PF) 1 % IJ SOLN
30.0000 mL | INTRAMUSCULAR | Status: DC | PRN
  Filled 2018-10-25: qty 30

## 2018-10-25 MED ORDER — PRENATAL MULTIVITAMIN CH
1.0000 | ORAL_TABLET | Freq: Every day | ORAL | Status: DC
  Administered 2018-10-26: 1 via ORAL
  Filled 2018-10-25: qty 1

## 2018-10-25 MED ORDER — IBUPROFEN 600 MG PO TABS
600.0000 mg | ORAL_TABLET | Freq: Once | ORAL | Status: AC
  Administered 2018-10-25: 600 mg via ORAL
  Filled 2018-10-25: qty 1

## 2018-10-25 MED ORDER — ONDANSETRON HCL 4 MG PO TABS: 4.0000 mg | ORAL_TABLET | ORAL | Status: DC | PRN

## 2018-10-25 MED ORDER — OXYTOCIN 40 UNITS IN LACTATED RINGERS INFUSION - SIMPLE MED
1.0000 m[IU]/min | INTRAVENOUS | Status: DC
  Administered 2018-10-25: 2 m[IU]/min via INTRAVENOUS
  Filled 2018-10-25: qty 1000

## 2018-10-25 MED ORDER — TETANUS-DIPHTH-ACELL PERTUSSIS 5-2.5-18.5 LF-MCG/0.5 IM SUSP: 0.5000 mL | Freq: Once | INTRAMUSCULAR | Status: DC

## 2018-10-25 MED ORDER — SOD CITRATE-CITRIC ACID 500-334 MG/5ML PO SOLN: 30.0000 mL | ORAL | Status: DC | PRN

## 2018-10-25 MED ORDER — DIBUCAINE 1 % RE OINT: 1.0000 "application " | TOPICAL_OINTMENT | RECTAL | Status: DC | PRN

## 2018-10-25 MED ORDER — ONDANSETRON HCL 4 MG/2ML IJ SOLN
4.0000 mg | Freq: Four times a day (QID) | INTRAMUSCULAR | Status: DC | PRN
  Administered 2018-10-25: 4 mg via INTRAVENOUS
  Filled 2018-10-25: qty 2

## 2018-10-25 MED ORDER — SIMETHICONE 80 MG PO CHEW: 80.0000 mg | CHEWABLE_TABLET | ORAL | Status: DC | PRN

## 2018-10-25 MED ORDER — ONDANSETRON HCL 4 MG/2ML IJ SOLN: 4.0000 mg | INTRAMUSCULAR | Status: DC | PRN

## 2018-10-25 MED ORDER — WITCH HAZEL-GLYCERIN EX PADS: 1.0000 "application " | MEDICATED_PAD | CUTANEOUS | Status: DC | PRN

## 2018-10-25 MED ORDER — COCONUT OIL OIL
1.0000 "application " | TOPICAL_OIL | Status: DC | PRN
  Administered 2018-10-26: 1 via TOPICAL
  Filled 2018-10-25: qty 120

## 2018-10-25 MED ORDER — OXYTOCIN 40 UNITS IN LACTATED RINGERS INFUSION - SIMPLE MED: 2.5000 [IU]/h | INTRAVENOUS | Status: DC

## 2018-10-25 MED ORDER — LACTATED RINGERS IV SOLN
INTRAVENOUS | Status: DC
  Administered 2018-10-25 (×3): via INTRAVENOUS

## 2018-10-25 MED ORDER — FLEET ENEMA 7-19 GM/118ML RE ENEM: 1.0000 | ENEMA | RECTAL | Status: DC | PRN

## 2018-10-25 MED ORDER — OXYTOCIN BOLUS FROM INFUSION
500.0000 mL | Freq: Once | INTRAVENOUS | Status: AC
  Administered 2018-10-25: 500 mL via INTRAVENOUS

## 2018-10-25 MED ORDER — TERBUTALINE SULFATE 1 MG/ML IJ SOLN
0.2500 mg | Freq: Once | INTRAMUSCULAR | Status: DC | PRN
  Filled 2018-10-25: qty 1

## 2018-10-25 MED ORDER — SENNOSIDES-DOCUSATE SODIUM 8.6-50 MG PO TABS
2.0000 | ORAL_TABLET | ORAL | Status: DC
  Administered 2018-10-25 – 2018-10-27 (×2): 2 via ORAL
  Filled 2018-10-25 (×2): qty 2

## 2018-10-25 MED ORDER — PENICILLIN G 3 MILLION UNITS IVPB - SIMPLE MED
3.0000 10*6.[IU] | INTRAVENOUS | Status: DC
  Administered 2018-10-25: 3 10*6.[IU] via INTRAVENOUS
  Filled 2018-10-25 (×3): qty 100

## 2018-10-25 MED ORDER — ACETAMINOPHEN 325 MG PO TABS
650.0000 mg | ORAL_TABLET | ORAL | Status: DC | PRN
  Administered 2018-10-26: 650 mg via ORAL
  Filled 2018-10-25: qty 2

## 2018-10-25 MED ORDER — SODIUM CHLORIDE 0.9 % IV SOLN
5.0000 10*6.[IU] | Freq: Once | INTRAVENOUS | Status: AC
  Administered 2018-10-25: 5 10*6.[IU] via INTRAVENOUS
  Filled 2018-10-25: qty 5

## 2018-10-25 MED ORDER — FENTANYL CITRATE (PF) 100 MCG/2ML IJ SOLN
100.0000 ug | INTRAMUSCULAR | Status: DC | PRN
  Administered 2018-10-25: 100 ug via INTRAVENOUS
  Filled 2018-10-25: qty 2

## 2018-10-25 MED ORDER — VALACYCLOVIR HCL 500 MG PO TABS
500.0000 mg | ORAL_TABLET | Freq: Two times a day (BID) | ORAL | Status: DC
  Filled 2018-10-25 (×3): qty 1

## 2018-10-25 NOTE — H&P (Signed)
LABOR AND DELIVERY ADMISSION HISTORY AND PHYSICAL NOTE  Brooke Briggs is a 27 y.o. 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  She has had leakage of fluid since 1230 today with intermittent bloody show. She has been having intermittent contractions for two days, and reports normal fetal movement. Prenatal History/Complications: PNC at [facility] Pregnancy complications: HSV, GBS positive, depression  Past Medical History: Past Medical History:  Diagnosis Date  . Anxiety   . Depression   . HSV-2 infection   . Vaginal Pap smear, abnormal     Past Surgical History: History reviewed. No pertinent surgical history.  Obstetrical History: OB History    Gravida  2   Para  1   Term  1   Preterm      AB      Living  1     SAB      TAB      Ectopic      Multiple      Live Births              Social History: Social History   Socioeconomic History  . Marital status: Single    Spouse name: Not on file  . Number of children: 1  . Years of education: 2313  . Highest education level: High school graduate  Occupational History  . Occupation: unemployed  Social Needs  . Financial resource strain: Not hard at all  . Food insecurity:    Worry: Never true    Inability: Never true  . Transportation needs:    Medical: No    Non-medical: No  Tobacco Use  . Smoking status: Never Smoker  . Smokeless tobacco: Never Used  Substance and Sexual Activity  . Alcohol use: Not Currently  . Drug use: Not Currently    Types: Marijuana  . Sexual activity: Not Currently    Birth control/protection: None  Lifestyle  . Physical activity:    Days per week: 3 days    Minutes per session: 30 min  . Stress: Not on file  Relationships  . Social connections:    Talks on phone: Three times a week    Gets together: Three times a week    Attends  religious service: More than 4 times per year    Active member of club or organization: No    Attends meetings of clubs or organizations: Never    Relationship status: Divorced  Other Topics Concern  . Not on file  Social History Narrative  . Not on file    Family History: Family History  Problem Relation Age of Onset  . Depression Mother   . Anxiety disorder Mother   . ADD / ADHD Mother   . Hypertension Mother   . Hypertension Father   . Hypertension Maternal Grandmother   . Hypertension Maternal Aunt     Allergies: Allergies  Allergen Reactions  . Dilaudid [Hydromorphone Hcl] Nausea And Vomiting  . Hydromorphone Other (See Comments)    Medications Prior to Admission  Medication Sig Dispense Refill Last Dose  . busPIRone (BUSPAR) 10 MG tablet Take by mouth.   10/25/2018 at Unknown time  . Prenatal Multivit-Min-Fe-FA (PRENATAL VITAMINS) 0.8 MG tablet Take by mouth.   10/25/2018 at Unknown time  . valACYclovir (VALTREX) 500 MG tablet Take 1 tablet (500 mg total) by mouth 2 (two) times daily. 60 tablet 2 10/25/2018 at  Unknown time     Review of Systems  All systems reviewed and negative except as stated in HPI  Physical Exam Blood pressure 127/86, pulse (!) 127, temperature 98.5 F (36.9 C), temperature source Oral, resp. rate 16, height 5\' 4"  (1.626 m), weight 76.7 kg, last menstrual period 01/29/2018, SpO2 99 %. General appearance: alert, oriented, NAD Lungs: normal respiratory effort Heart: regular rate Abdomen: soft, non-tender; gravid, FH appropriate for GA Extremities: No calf swelling or tenderness Presentation: cephalic by sutures Fetal monitoring: baseline 140, moderate variability, + accel, - decel Uterine activity: irregular contractions Dilation: 5 Effacement (%): 50 Station: -3 Exam by:: Dr. Andrey Campanile (resident)  Prenatal labs: ABO, Rh: --/--/A POS (12/17 1348) Antibody: NEG (12/17 1348) Rubella:   RPR: NON-REACTIVE (10/07 0959)  HBsAg:    HIV:  NON-REACTIVE (10/07 0959)  GC/Chlamydia: neg/neg GBS:   positive 2-hr GTT: 97 Genetic screening:  negative Anatomy US: female fetus, normal anatomy   Prenatal Transfer Tool  Maternal Diabetes: No Genetic Screening: Normal Maternal Ultrasounds/Referrals: Normal Fetal Ultrasounds or other Referrals:  None Maternal Substance Abuse:  No Significant Maternal Medications:  Meds include: Zoloft Other: Valtrex Significant Maternal Lab Results: Lab values include: Group B Strep positive  Results for orders placed or performed during the hospital encounter of 10/25/18 (from the past 24 hour(s))  POCT fern test   Collection Time: 10/25/18  1:30 PM  Result Value Ref Range   POCT Fern Test Positive = ruptured amniotic membanes   CBC   Collection Time: 10/25/18  1:48 PM  Result Value Ref Range   WBC 16.8 (H) 4.0 - 10.5 K/uL   RBC 3.54 (L) 3.87 - 5.11 MIL/uL   Hemoglobin 9.6 (L) 12.0 - 15.0 g/dL   HCT 69.6 (L) 29.5 - 28.4 %   MCV 85.0 80.0 - 100.0 fL   MCH 27.1 26.0 - 34.0 pg   MCHC 31.9 30.0 - 36.0 g/dL   RDW 13.2 44.0 - 10.2 %   Platelets 426 (H) 150 - 400 K/uL   nRBC 0.0 0.0 - 0.2 %  Type and screen [facility]   Collection Time: 10/25/18  1:48 PM  Result Value Ref Range   ABO/RH(D) A POS    Antibody Screen NEG    Sample Expiration      10/28/2018 Performed at [facility], [address], [place], [place] [address]     Patient Active Problem List   Diagnosis Date Noted  . PROM (premature rupture of membranes) 10/25/2018  . GBS (group B Streptococcus carrier), +RV culture, currently pregnant 10/15/2018  . Depression affecting pregnancy in third trimester, antepartum 09/13/2018  . HSV-2 infection complicating pregnancy, third trimester 09/13/2018  . Supervision of other normal pregnancy, antepartum 05/30/2018    Assessment: Brooke Briggs is a 27 y.o. G2P1001 at [redacted]w[redacted]d here for PROM.   #HSV: no active lesions noted on speculum or external exam, adherent to  suppressive therapy #Labor: pitocin #Pain: IV pain control per patient request #FWB: Baseline 140, mod variability, + accel, - decel #ID:  GBS positive, on penicillin #MOF: breast/bottle #MOC: IUD #Circ:  yes  Justine Null, MD PGY-3 Family Medicine 10/25/2018, 3:55 PM

## 2018-10-25 NOTE — MAU Note (Signed)
Pt presents from MAU with c/o PROM. Pt has felt leaking of fluid since 1230 today, has had bloody show and intermittent ctx x 2 days. +FM

## 2018-10-26 LAB — CBC
HCT: 29.2 % — ABNORMAL LOW (ref 36.0–46.0)
Hemoglobin: 9.2 g/dL — ABNORMAL LOW (ref 12.0–15.0)
MCH: 27 pg (ref 26.0–34.0)
MCHC: 31.5 g/dL (ref 30.0–36.0)
MCV: 85.6 fL (ref 80.0–100.0)
PLATELETS: 397 10*3/uL (ref 150–400)
RBC: 3.41 MIL/uL — ABNORMAL LOW (ref 3.87–5.11)
RDW: 15 % (ref 11.5–15.5)
WBC: 23 10*3/uL — AB (ref 4.0–10.5)
nRBC: 0 % (ref 0.0–0.2)

## 2018-10-26 LAB — RPR: RPR Ser Ql: NONREACTIVE

## 2018-10-26 NOTE — Lactation Note (Signed)
This note was copied from a baby's chart. Lactation Consultation Note  Patient Name: Boy Ardean LarsenLashay Doig ZOXWR'UToday's Date: 10/26/2018   Baby 18 hours old.  Mother has been breastfeeding and recently introduced formula due to concern over her milk supply. Mother hand expressed good flow of colostrum which reassured mother. Discussed supply and demand.  Feed on demand approximately 8-12 times per day.   Mother recently gave baby 5 ml of formula.  Encouraged mother to breastfeed before offering formula.      Maternal Data    Feeding    LATCH Score                   Interventions    Lactation Tools Discussed/Used     Consult Status      Hardie PulleyBerkelhammer, Ruth Boschen 10/26/2018, 1:35 PM

## 2018-10-26 NOTE — Progress Notes (Signed)
MOB was referred for history of depression. * Referral screened out by Clinical Social Worker because none of the following criteria appear to apply: ~ History of anxiety/depression during this pregnancy, or of post-partum depression following prior delivery. ~ Diagnosis of anxiety and/or depression within last 3 years OR * MOB's symptoms currently being treated with medication and/or therapy. (Per OB records review, MOB was started on Zoloft during pregnancy and is doing well.)  Please contact the Clinical Social Worker if needs arise, by MOB request, or if MOB scores greater than 9/yes to question 10 on Edinburgh Postpartum Depression Screen.  Lamoyne Hessel, LCSWA Clinical Social Worker [facility] Cell#: [phone]           

## 2018-10-26 NOTE — Progress Notes (Signed)
POSTPARTUM PROGRESS NOTE  Post Partum Day 1  Subjective:  Brooke Briggs is a 27 y.o. [id number] s/p SVD at 8855w3d.  She reports she is doing well. No acute events overnight. She denies any problems with ambulating, voiding or po intake. Denies nausea or vomiting.  Pain is well controlled.  Lochia is mild. No questions or concerns at this time.   Objective: Blood pressure 113/87, pulse 81, temperature 97.8 F (36.6 C), temperature source Oral, resp. rate 16, height 5\' 4"  (1.626 m), weight 76.7 kg, last menstrual period 01/29/2018, SpO2 100 %, unknown if currently breastfeeding.  Physical Exam:  General: alert, cooperative and no distress Chest: no respiratory distress Heart:regular rate, distal pulses intact Abdomen: soft, nontender,  Uterine Fundus: firm, appropriately tender DVT Evaluation: No calf swelling or tenderness Extremities: no edema Skin: warm, dry  Recent Labs    10/25/18 1348 10/26/18 0536  HGB 9.6* 9.2*  HCT 30.1* 29.2*    Assessment/Plan: Brooke Briggs is a 27 y.o. [id number] s/p SVD at 2155w3d   PPD#1 - Doing well  Routine postpartum care  Contraception: IUD  Feeding: breast (bottle augmentation if necessary)  Dispo: Plan for discharge tomorrow.   LOS: 1 day   Genene ChurnJohn Jowan Skillin, MS3 10/26/2018, 7:51 AM

## 2018-10-27 ENCOUNTER — Ambulatory Visit: Payer: Self-pay

## 2018-10-27 MED ORDER — IBUPROFEN 600 MG PO TABS: 600.0000 mg | ORAL_TABLET | Freq: Four times a day (QID) | ORAL | 0 refills | Status: DC | PRN

## 2018-10-27 NOTE — Discharge Instructions (Signed)
Vaginal Delivery, Care After °Refer to this sheet in the next few weeks. These instructions provide you with information about caring for yourself after vaginal delivery. Your health care provider may also give you more specific instructions. Your treatment has been planned according to current medical practices, but problems sometimes occur. Call your health care provider if you have any problems or questions. °What can I expect after the procedure? °After vaginal delivery, it is common to have: °· Some bleeding from your vagina. °· Soreness in your abdomen, your vagina, and the area of skin between your vaginal opening and your anus (perineum). °· Pelvic cramps. °· Fatigue. °Follow these instructions at home: °Medicines °· Take over-the-counter and prescription medicines only as told by your health care provider. °· If you were prescribed an antibiotic medicine, take it as told by your health care provider. Do not stop taking the antibiotic until it is finished. °Driving ° °· Do not drive or operate heavy machinery while taking prescription pain medicine. °· Do not drive for 24 hours if you received a sedative. °Lifestyle °· Do not drink alcohol. This is especially important if you are breastfeeding or taking medicine to relieve pain. °· Do not use tobacco products, including cigarettes, chewing tobacco, or e-cigarettes. If you need help quitting, ask your health care provider. °Eating and drinking °· Drink at least 8 eight-ounce glasses of water every day unless you are told not to by your health care provider. If you choose to breastfeed your baby, you may need to drink more water than this. °· Eat high-fiber foods every day. These foods may help prevent or relieve constipation. High-fiber foods include: °? Whole grain cereals and breads. °? Brown rice. °? Beans. °? Fresh fruits and vegetables. °Activity °· Return to your normal activities as told by your health care provider. Ask your health care provider what  activities are safe for you. °· Rest as much as possible. Try to rest or take a nap when your baby is sleeping. °· Do not lift anything that is heavier than your baby or 10 lb (4.5 kg) until your health care provider says that it is safe. °· Talk with your health care provider about when you can engage in sexual activity. This may depend on your: °? Risk of infection. °? Rate of healing. °? Comfort and desire to engage in sexual activity. °Vaginal Care °· If you have an episiotomy or a vaginal tear, check the area every day for signs of infection. Check for: °? More redness, swelling, or pain. °? More fluid or blood. °? Warmth. °? Pus or a bad smell. °· Do not use tampons or douches until your health care provider says this is safe. °· Watch for any blood clots that may pass from your vagina. These may look like clumps of dark red, brown, or black discharge. °General instructions °· Keep your perineum clean and dry as told by your health care provider. °· Wear loose, comfortable clothing. °· Wipe from front to back when you use the toilet. °· Ask your health care provider if you can shower or take a bath. If you had an episiotomy or a perineal tear during labor and delivery, your health care provider may tell you not to take baths for a certain length of time. °· Wear a bra that supports your breasts and fits you well. °· If possible, have someone help you with household activities and help care for your baby for at least a few days after you   leave the hospital. °· Keep all follow-up visits for you and your baby as told by your health care provider. This is important. °Contact a health care provider if: °· You have: °? Vaginal discharge that has a bad smell. °? Difficulty urinating. °? Pain when urinating. °? A sudden increase or decrease in the frequency of your bowel movements. °? More redness, swelling, or pain around your episiotomy or vaginal tear. °? More fluid or blood coming from your episiotomy or vaginal  tear. °? Pus or a bad smell coming from your episiotomy or vaginal tear. °? A fever. °? A rash. °? Little or no interest in activities you used to enjoy. °? Questions about caring for yourself or your baby. °· Your episiotomy or vaginal tear feels warm to the touch. °· Your episiotomy or vaginal tear is separating or does not appear to be healing. °· Your breasts are painful, hard, or turn red. °· You feel unusually sad or worried. °· You feel nauseous or you vomit. °· You pass large blood clots from your vagina. If you pass a blood clot from your vagina, save it to show to your health care provider. Do not flush blood clots down the toilet without having your health care provider look at them. °· You urinate more than usual. °· You are dizzy or light-headed. °· You have not breastfed at all and you have not had a menstrual period for 12 weeks after delivery. °· You have stopped breastfeeding and you have not had a menstrual period for 12 weeks after you stopped breastfeeding. °Get help right away if: °· You have: °? Pain that does not go away or does not get better with medicine. °? Chest pain. °? Difficulty breathing. °? Blurred vision or spots in your vision. °? Thoughts about hurting yourself or your baby. °· You develop pain in your abdomen or in one of your legs. °· You develop a severe headache. °· You faint. °· You bleed from your vagina so much that you fill two sanitary pads in one hour. °This information is not intended to replace advice given to you by your health care provider. Make sure you discuss any questions you have with your health care provider. °Document Released: 10/23/2000 Document Revised: 04/08/2016 Document Reviewed: 11/10/2015 °Elsevier Interactive Patient Education © 2019 [employer] Inc[employer] ° °

## 2018-10-27 NOTE — Lactation Note (Signed)
This note was copied from a baby's chart. Lactation Consultation Note  Patient Name: Brooke Briggs ZOXWR'UToday's Date: 10/27/2018 Reason for consult: Follow-up assessment;Early term 37-38.6wks;Nipple pain/trauma   Follow up with mokm of 40 hour old infant. Infant with 5 BF for 10-30 minutes, 1 BF attempt, formula x 2 of 5-20 cc, 5 voids and 4 stools in the last 24 hours. Infant asleep in the crib. Infant weight 6 pounds 7.2 ounces with 8% weight loss since birth.   Mom with sore nipples mainly with latch, nipple care reviewed. Mom has not been BF every feeding due to soreness. Mom did not want assistance at this time.   Enc mom to BF infant with feeding cues with at least 8 feeds a day, offering both breasts with each feeding. Enc mom to offer supplement of EBM or formula if infant not able to BF or if still cueing to feed post BF. Enc mom to pump with DEBP each feeding and at least when infant getting a bottle.   Mom has been using a # 20 NS with feeding, she describes it being tight, mom given another 20 at her request and a # 24 NS for home. Reviewed how to apply and how infant should look at the breast with NS in place. Enc mom to try the larger size with next feeding. Mom is aware to maintain pumping 4 x a day if using the NS even if infant feeding well.   Reviewed I/O, signs of dehydration in the infant, signs your infant is getting enough, engorgement prevention/treatment and breast milk storage and expression. Mom with Medela PIS and manual pump for home use.   [facility] reviewed, mom aware of OP services, BF Support Groups and LC phone #. Enc mom to call and set up follow up OP appt, mom did not wish to set up at this time.   Mom reports she has no questions/concerns as needed. Mom to call with any questions/concerns as needed.     Maternal Data Formula Feeding for Exclusion: Yes Reason for exclusion: Mother's choice to formula and breast feed on admission Has patient been taught  Hand Expression?: Yes Does the patient have breastfeeding experience prior to this delivery?: Yes  Feeding    LATCH Score                   Interventions Interventions: Breast feeding basics reviewed;Skin to skin;Breast compression;Hand express;Breast massage;Expressed milk;Coconut oil  Lactation Tools Discussed/Used Pump Review: Setup, frequency, and cleaning;Milk Storage Initiated by:: Reviewed and encouraged every 3 hours post feeding or anytime infant getting a bottle with at least 4 pumpings a day if using the NS full time.    Consult Status Consult Status: Complete    Silas FloodSharon S Tomesha Sargent 10/27/2018, 11:25 AM

## 2018-10-27 NOTE — Lactation Note (Signed)
This note was copied from a baby's chart. Lactation Consultation Note  Patient Name: Brooke Briggs WUJWJ'XToday's Date: 10/27/2018   Follow up with mom at request of RN. Mom was in the shower. Dad reports infant fed about 30 minutes ago and took a bottle of formula. Asked dad to have mom call back out when she is out of the shower, dad voiced understanding.      Maternal Data    Feeding    LATCH Score                   Interventions    Lactation Tools Discussed/Used     Consult Status      Brooke Briggs 10/27/2018, 10:47 AM

## 2018-10-27 NOTE — Discharge Summary (Signed)
Postpartum Discharge Summary     Patient Name: Brooke Briggs DOB: 28-Apr-1991 MRN: [id number]  Date of admission: 10/25/2018 Delivering Provider: Henderson NewcomerWILSON, BRIDGID H   Date of discharge: 10/27/2018  Admitting diagnosis: 39wks, water broke, CTX 2min Intrauterine pregnancy: 9978w3d     Secondary diagnosis:  Principal Problem:   PROM (premature rupture of membranes) Active Problems:   Depression affecting pregnancy in third trimester, antepartum   HSV-2 infection complicating pregnancy, third trimester   GBS (group B Streptococcus carrier), +RV culture, currently pregnant  Additional problems: none     Discharge diagnosis: Term Pregnancy Delivered                                                                                                Post partum procedures: none  Augmentation: Pitocin  Complications: None  Hospital course:  Onset of Labor With Vaginal Delivery     27 y.o. yo [id number] at 678w3d was admitted in Latent Labor with PROM on 10/25/2018. Patient had an uncomplicated labor course as follows:  Membrane Rupture Time/Date: 1:00 PM ,10/25/2018   Intrapartum Procedures: Episiotomy: None [1]                                         Lacerations:  None [1]  Patient had a delivery of a Viable infant. 10/25/2018  Information for the patient's newborn:  Dionne Miloage, Boy Billy [[id number]][[id number]]  Delivery Method: Vaginal, Spontaneous(Filed from Delivery Summary)    Pateint had an uncomplicated postpartum course.  She is ambulating, tolerating a regular diet, passing flatus, and urinating well. Patient is discharged home in stable condition on 10/27/18.   Magnesium Sulfate recieved: No BMZ received: No  Physical exam  Vitals:   10/26/18 1045 10/26/18 1350 10/26/18 2306 10/27/18 0540  BP: 111/78 125/81 117/80 112/81  Pulse: 83 68 73 86  Resp: 16 16  16   Temp: 97.7 F (36.5 C) (!) 97.4 F (36.3 C) 97.9 F (36.6 C) 97.7 F (36.5 C)  TempSrc: Oral Oral Oral Oral  SpO2: 100%  99% 100%   Weight:      Height:       General: alert, resting comfortably in bed Lochia: appropriate Uterine Fundus: firm Incision: N/A DVT Evaluation: No evidence of DVT seen on physical exam. Labs: Lab Results  Component Value Date   WBC 23.0 (H) 10/26/2018   HGB 9.2 (L) 10/26/2018   HCT 29.2 (L) 10/26/2018   MCV 85.6 10/26/2018   PLT 397 10/26/2018   No flowsheet data found.  Discharge instruction: per After Visit Summary and "Baby and Me Booklet".  After visit meds:  Allergies as of 10/27/2018      Reactions   Dilaudid [hydromorphone Hcl] Nausea And Vomiting   Hydromorphone Other (See Comments)      Medication List    STOP taking these medications   valACYclovir 500 MG tablet Commonly known as:  VALTREX     TAKE these medications   busPIRone 10 MG tablet Commonly known as:  BUSPAR Take  by mouth.   ibuprofen 600 MG tablet Commonly known as:  ADVIL,MOTRIN Take 1 tablet (600 mg total) by mouth every 6 (six) hours as needed.   Prenatal Vitamins 0.8 MG tablet Take by mouth.       Diet: routine diet  Activity: Advance as tolerated. Pelvic rest for 6 weeks.   Outpatient follow up:4 weeks Follow up Appt:No future appointments. Follow up Visit: Follow-up Information    [facility] [place]. Schedule an appointment as soon as possible for a visit in 4 week(s).   Specialty:  Obstetrics and Gynecology Why:  for your postpartum appointment Contact information: [address], [address] [place] [id number] [phone]           Please schedule this patient for Postpartum visit in: 4 weeks with the following provider: Any provider For C/S patients schedule nurse incision check in weeks 2 weeks: no Low risk pregnancy complicated by: HSV, GBS positive Delivery mode:  SVD Anticipated Birth Control:  IUD, to be placed outpatient PP Procedures needed: none  Schedule Integrated BH visit: no      Newborn Data: Live  born female  Birth Weight: 6 lb 15.8 oz (3170 g) APGAR: 7, 9  Newborn Delivery   Birth date/time:  10/25/2018 19:13:00 Delivery type:  Vaginal, Spontaneous     Baby Feeding: Breast Disposition:home with mother   10/27/2018 Arabella MerlesKimberly D Abid Bolla, CNM  9:19 AM

## 2018-10-31 ENCOUNTER — Telehealth: Payer: Self-pay | Admitting: *Deleted

## 2018-10-31 ENCOUNTER — Encounter: Payer: Medicaid Other | Admitting: Obstetrics & Gynecology

## 2018-10-31 MED ORDER — ESCITALOPRAM OXALATE 10 MG PO TABS: 10.0000 mg | ORAL_TABLET | Freq: Every day | ORAL | 1 refills | Status: DC

## 2018-10-31 NOTE — Telephone Encounter (Signed)
Pt called stating that she was on Buspar during pregnancy but when she left the hospital she was told she could not take it and breast feed.  Per Dr Marice Potterove switch to Lexapro 10 mg and return to office in 2 weeks.

## 2018-11-17 ENCOUNTER — Ambulatory Visit (INDEPENDENT_AMBULATORY_CARE_PROVIDER_SITE_OTHER): Payer: Medicaid Other | Admitting: Obstetrics & Gynecology

## 2018-11-17 ENCOUNTER — Encounter: Payer: Self-pay | Admitting: Obstetrics & Gynecology

## 2018-11-17 VITALS — BP 129/86 | HR 90 | Ht 64.0 in | Wt 153.0 lb

## 2018-11-17 DIAGNOSIS — Z3202 Encounter for pregnancy test, result negative: Secondary | ICD-10-CM | POA: Diagnosis not present

## 2018-11-17 DIAGNOSIS — Z30013 Encounter for initial prescription of injectable contraceptive: Secondary | ICD-10-CM

## 2018-11-17 DIAGNOSIS — Z1389 Encounter for screening for other disorder: Secondary | ICD-10-CM

## 2018-11-17 DIAGNOSIS — Z01812 Encounter for preprocedural laboratory examination: Secondary | ICD-10-CM

## 2018-11-17 LAB — POCT URINE PREGNANCY: Preg Test, Ur: NEGATIVE

## 2018-11-17 MED ORDER — BUSPIRONE HCL 10 MG PO TABS: 10.0000 mg | ORAL_TABLET | Freq: Once | ORAL | 12 refills | Status: AC

## 2018-11-17 MED ORDER — MEDROXYPROGESTERONE ACETATE 150 MG/ML IM SUSP: 150.0000 mg | INTRAMUSCULAR | 4 refills | Status: DC

## 2018-11-17 NOTE — Progress Notes (Signed)
Here for postpartum visit.  Wants to use depo for contraception.  Had unproctected sex 2 days ago.

## 2018-11-17 NOTE — Progress Notes (Signed)
Post Partum Exam  Brooke Briggs is a 28 y.o. G48P2002 female who presents for a postpartum visit. She is 3 weeks 4 days postpartum following a spontaneous vaginal delivery. I have fully reviewed the prenatal and intrapartum course. The delivery was at 38 weeks 3 days.  Anesthesia: none. Postpartum course has been unremarkable. Baby's course has been unremarkable. Baby is feeding by bottle - Similac Neosure. Bleeding staining only. Bowel function is normal. Bladder function is normal. Patient is sexually active. Contraception method withdrawal last night. She wants Depo-Provera. Postpartum depression screening:neg  The following portions of the patient's history were reviewed and updated as appropriate: allergies, current medications, past family history, past medical history, past social history, past surgical history and problem list. Last pap smear done Feb 2018 and was Normal  Review of Systems Pertinent items are noted in HPI.    Objective:  Blood pressure 129/86, pulse 90, height 5\' 4"  (1.626 m), weight 153 lb (69.4 kg), last menstrual period 01/29/2018, unknown if currently breastfeeding. General:  alert   Breasts:  inspection negative, no nipple discharge or bleeding, no masses or nodularity palpable  Lungs: clear to auscultation bilaterally  Heart:  regular rate and rhythm, S1, S2 normal, no murmur, click, rub or gallop  Abdomen: soft, non-tender; bowel sounds normal; no masses,  no organomegaly   Vulva:  not evaluated  Vagina: not evaluated  Cervix:  not evaluated  Corpus: not examined  Adnexa:  not evaluated  Rectal Exam: Not performed.                                           Assessment:   Normal postpartum exam. Pap smear not done at today's visit.   Plan:   1. Contraception: Depo-Provera injections, medication prescribed. She will come back tomorrow for injection and will not have sex tonight. 2. She likes the buspar better than the lexapro and would like to  change back now that she is not breastfeeding (quit about 1 week ago)

## 2018-11-18 ENCOUNTER — Ambulatory Visit (INDEPENDENT_AMBULATORY_CARE_PROVIDER_SITE_OTHER): Payer: Medicaid Other

## 2018-11-18 DIAGNOSIS — Z30013 Encounter for initial prescription of injectable contraceptive: Secondary | ICD-10-CM

## 2018-11-18 DIAGNOSIS — Z3042 Encounter for surveillance of injectable contraceptive: Secondary | ICD-10-CM | POA: Diagnosis not present

## 2018-11-18 LAB — POCT URINE PREGNANCY: Preg Test, Ur: NEGATIVE

## 2018-11-18 MED ORDER — MEDROXYPROGESTERONE ACETATE 150 MG/ML IM SUSP
150.0000 mg | Freq: Once | INTRAMUSCULAR | Status: AC
  Administered 2018-11-18: 150 mg via INTRAMUSCULAR

## 2018-11-18 NOTE — Progress Notes (Signed)
Pt was seen by Dr.Dove yesterday for postpartum visit. Pt wanted Depo for birth control. PT told Dr.DOve she had unprotected intercourse within the past two weeks but he "pulled out". Dr.Dove is still allowing pt to get Depo today. Pregnancy test negative. Depo given.

## 2018-11-21 ENCOUNTER — Ambulatory Visit: Payer: Medicaid Other | Admitting: Obstetrics & Gynecology

## 2019-02-04 IMAGING — US US MFM OB FOLLOW-UP
1 series · 14 of 28 positions shown · non-contrast
Comparison: none

[Series 1: us mfm ob follow-up · 14 of 57 slices shown]
[im 3/57]
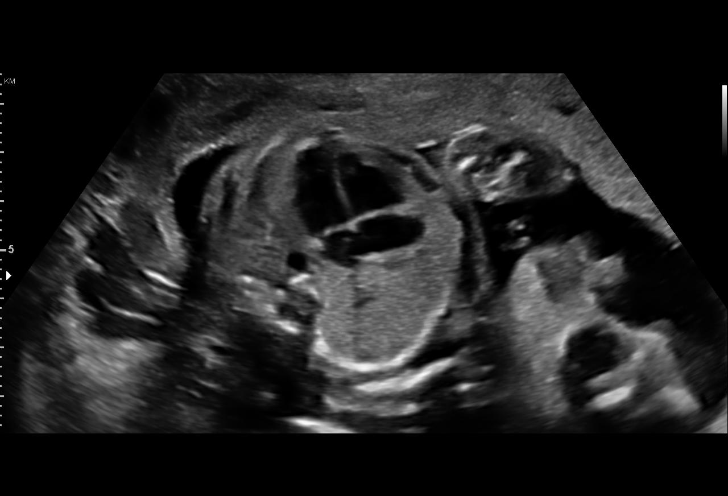
[im 7/57]
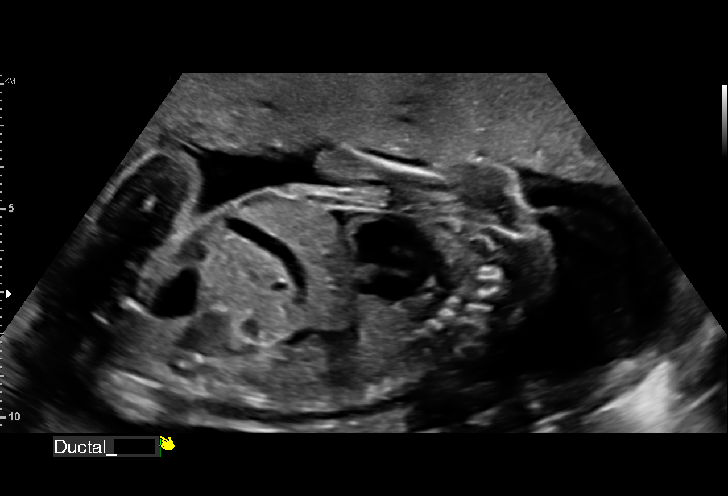
[im 11/57]
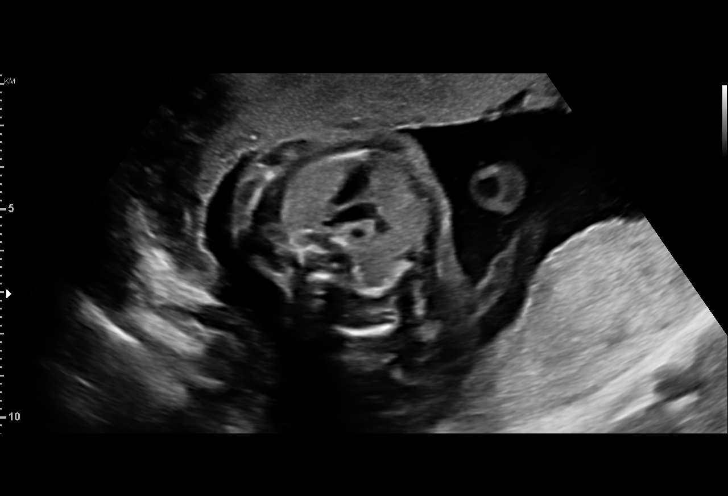
[im 15/57]
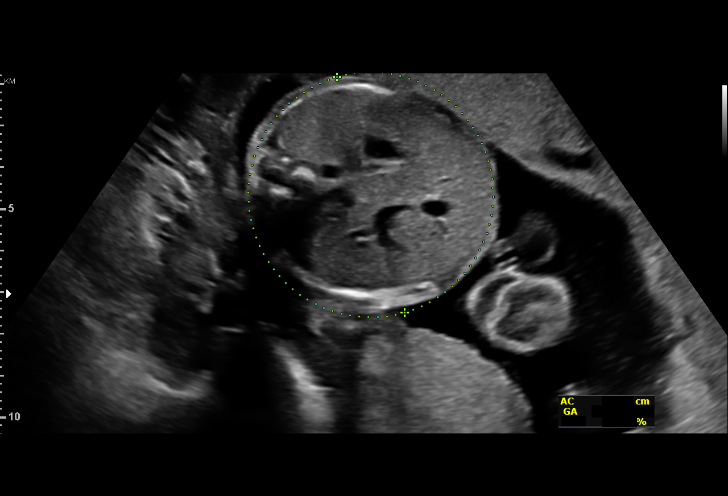
[im 19/57]
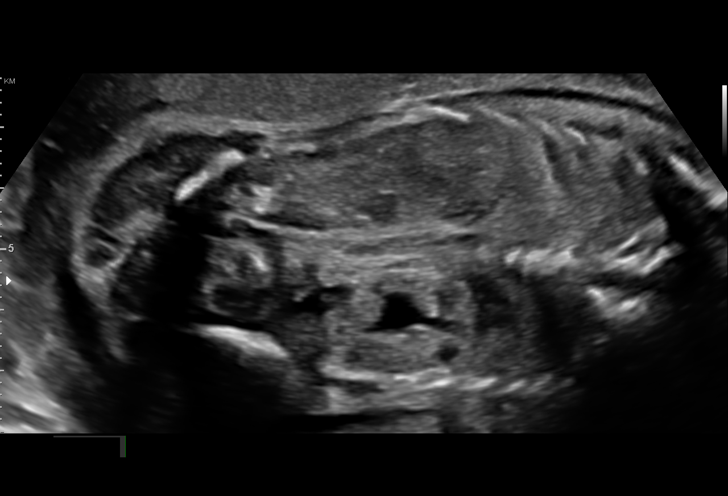
[im 23/57]
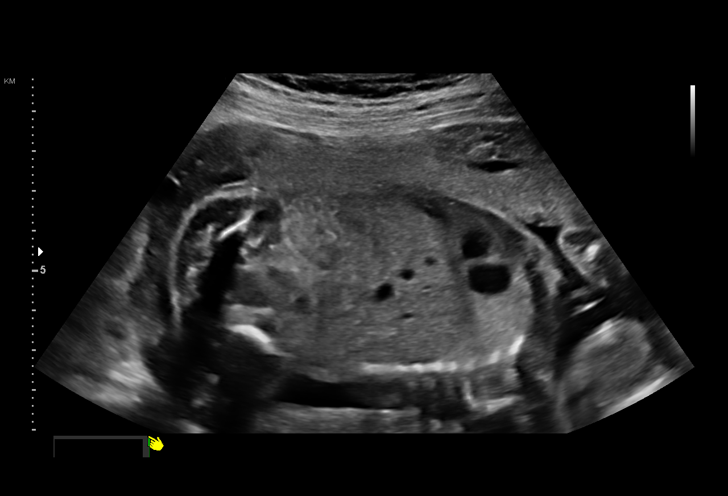
[im 27/57]
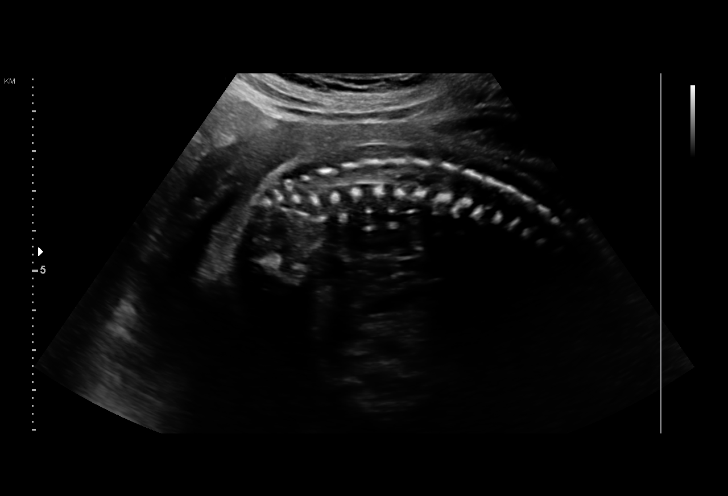
[im 32/57]
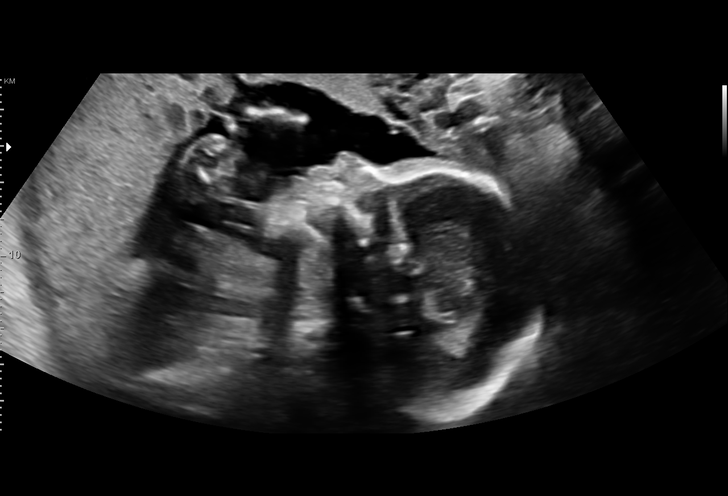
[im 36/57]
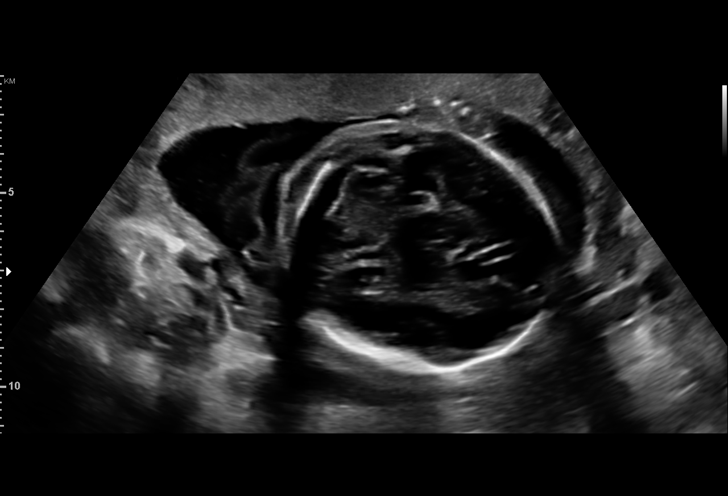
[im 40/57]
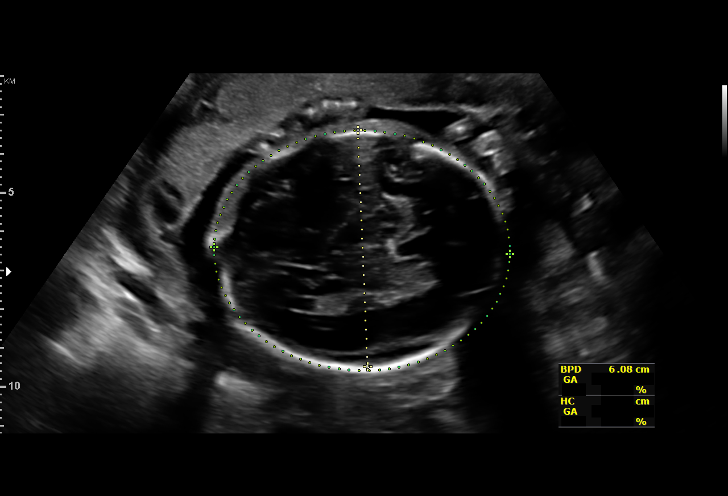
[im 44/57]
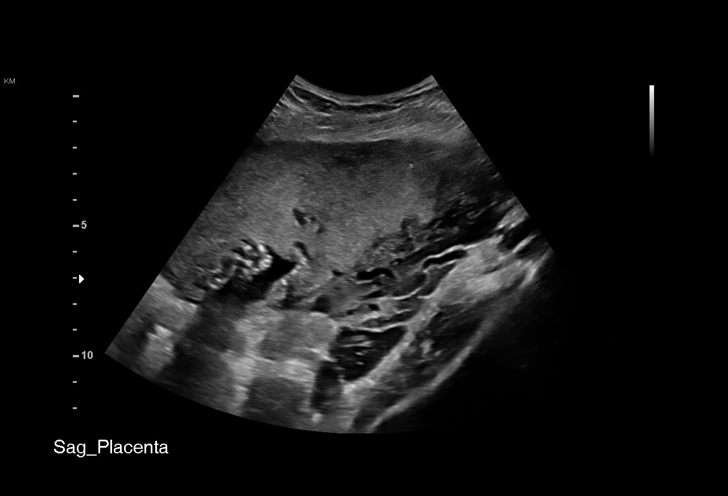
[im 48/57]
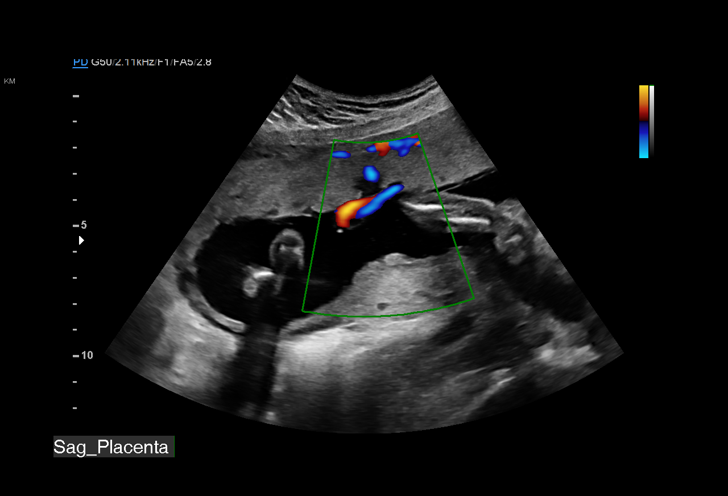
[im 52/57]
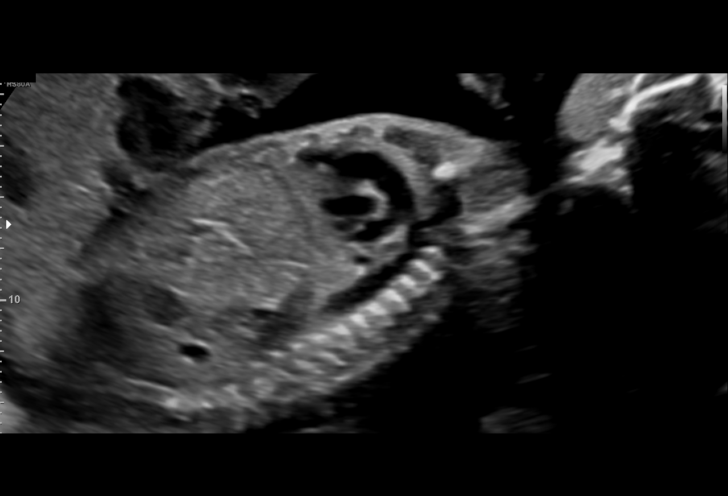
[im 57/57]
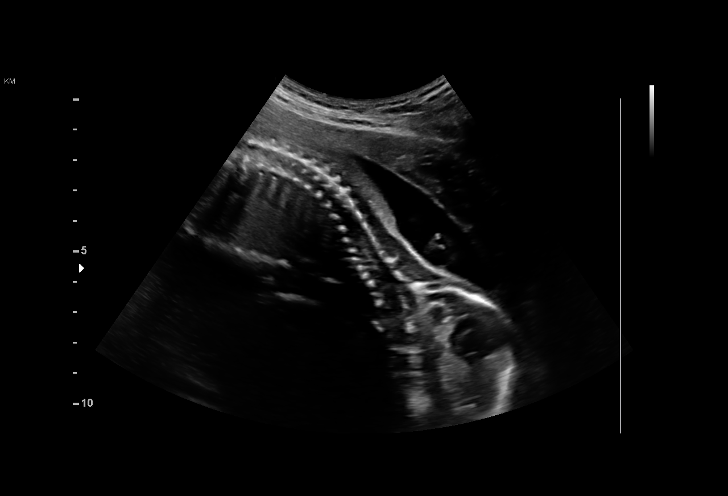

[14 of 28 positions shown; findings below may reference images not displayed]

Indications

Antenatal follow-up for nonvisualized fetal
anatomy
23 weeks gestation of pregnancy
Fetal abnormality - other known or
suspected (mild ventriculomegaly, resolved)
Vital Signs

Height:        5'4"
Fetal Evaluation

Num Of Fetuses:         1
Fetal Heart Rate(bpm):  151
Cardiac Activity:       Observed
Presentation:           Cephalic
Placenta:               Anterior
P. Cord Insertion:      Visualized

Amniotic Fluid
AFI FV:      Within normal limits
Biometry

BPD:      60.3  mm     G. Age:  24w 4d         83  %    CI:         76.7   %    70 - 86
FL/HC:      19.9   %    19.2 -
HC:      218.1  mm     G. Age:  23w 6d         51  %    HC/AC:      1.19        1.05 -
AC:      183.3  mm     G. Age:  23w 1d         33  %    FL/BPD:     72.1   %    71 - 87
FL:       43.5  mm     G. Age:  24w 2d         66  %    FL/AC:      23.7   %    20 - 24

Est. FW:     621  gm      1 lb 6 oz     57  %
OB History
Gravidity:    2         Term:   1
Living:       1
Gestational Age

LMP:           23w 3d        Date:  01/29/18                 EDD:   11/05/18
U/S Today:     24w 0d                                        EDD:   11/01/18
Best:          23w 3d     Det. By:  LMP  (01/29/18)          EDD:   11/05/18
Anatomy

Cranium:               Appears normal         Aortic Arch:            Appears normal
Cavum:                 Appears normal         Ductal Arch:            Appears normal
Ventricles:            Appears normal         Diaphragm:              Appears normal
Choroid Plexus:        Appears normal         Stomach:                Appears normal, left
sided
Cerebellum:            Appears normal         Abdomen:                Appears normal
Posterior Fossa:       Appears normal         Abdominal Wall:         Previously seen
Nuchal Fold:           Not applicable (>20    Cord Vessels:           Previously seen
wks GA)
Face:                  Orbits previously      Kidneys:                Appear normal
seen, profile NL
Lips:                  Previously seen        Bladder:                Appears normal
Thoracic:              Appears normal         Spine:                  Appears normal
Heart:                 Appears normal         Upper Extremities:      Previously seen
(4CH, axis, and
situs)
RVOT:                  Appears normal         Lower Extremities:      Previously seen
LVOT:                  Appears normal

Other:  Male gender. Heels and 5th digit previously visualized. Open hands
previously visualized. Nasal bone previously visualized. Technically
difficult due to fetal position.
Cervix Uterus Adnexa

Cervix
Length:            3.6  cm.
Normal appearance by transabdominal scan.
Impression

Patient returned for evaluation of lateral ventricles (upper limit
of normal on previous ultrasound).
Fetal growth is appropriate for gestational age. Amniotic fluid
is normal and good fetal activity is seen. Lateral ventricular
measurements are normal.

We reassured the patient of the findings.
Recommendations

Follow-up scans as clinically indicated.

## 2019-02-06 ENCOUNTER — Ambulatory Visit: Payer: Medicaid Other

## 2019-02-08 ENCOUNTER — Other Ambulatory Visit: Payer: Self-pay

## 2019-02-08 ENCOUNTER — Ambulatory Visit (INDEPENDENT_AMBULATORY_CARE_PROVIDER_SITE_OTHER): Payer: Medicaid Other | Admitting: *Deleted

## 2019-02-08 DIAGNOSIS — Z3042 Encounter for surveillance of injectable contraceptive: Secondary | ICD-10-CM | POA: Diagnosis not present

## 2019-02-08 DIAGNOSIS — Z30013 Encounter for initial prescription of injectable contraceptive: Secondary | ICD-10-CM

## 2019-02-08 MED ORDER — MEDROXYPROGESTERONE ACETATE 150 MG/ML IM SUSP
150.0000 mg | INTRAMUSCULAR | Status: AC
  Administered 2019-02-08: 14:00:00 150 mg via INTRAMUSCULAR

## 2019-02-08 NOTE — Progress Notes (Signed)
Pt here for Depo Provera injection only. 

## 2019-03-01 ENCOUNTER — Other Ambulatory Visit: Payer: Self-pay | Admitting: *Deleted

## 2019-03-01 MED ORDER — VALACYCLOVIR HCL 500 MG PO TABS: 500.0000 mg | ORAL_TABLET | Freq: Two times a day (BID) | ORAL | 6 refills | Status: DC

## 2019-04-26 ENCOUNTER — Ambulatory Visit: Payer: Medicaid Other | Admitting: *Deleted

## 2019-05-03 ENCOUNTER — Ambulatory Visit: Payer: Medicaid Other

## 2019-06-06 ENCOUNTER — Other Ambulatory Visit: Payer: Self-pay

## 2019-06-06 ENCOUNTER — Ambulatory Visit (INDEPENDENT_AMBULATORY_CARE_PROVIDER_SITE_OTHER): Payer: Medicaid Other

## 2019-06-06 DIAGNOSIS — Z3202 Encounter for pregnancy test, result negative: Secondary | ICD-10-CM | POA: Diagnosis not present

## 2019-06-06 DIAGNOSIS — Z789 Other specified health status: Secondary | ICD-10-CM

## 2019-06-06 LAB — POCT URINE PREGNANCY: Preg Test, Ur: NEGATIVE

## 2019-06-06 NOTE — Progress Notes (Signed)
Pt here for UPT after missed Depo Provera injection. UPT negative. Pt will return in 2 weeks for injection. Pt is aware to not have intercourse for the next 2 weeks.

## 2019-06-20 ENCOUNTER — Telehealth: Payer: Self-pay | Admitting: *Deleted

## 2019-06-20 ENCOUNTER — Ambulatory Visit: Payer: Medicaid Other

## 2019-06-20 NOTE — Telephone Encounter (Signed)
Left patient a message about keeping appointment on 06/20/2019.

## 2020-01-02 ENCOUNTER — Encounter: Payer: Self-pay | Admitting: Advanced Practice Midwife

## 2020-01-02 ENCOUNTER — Other Ambulatory Visit (HOSPITAL_COMMUNITY)
Admission: RE | Admit: 2020-01-02 | Discharge: 2020-01-02 | Disposition: A | Discharge status: Home / Self Care | Payer: Managed Care, Other (non HMO) | Source: Ambulatory Visit | Attending: Advanced Practice Midwife | Admitting: Advanced Practice Midwife

## 2020-01-02 ENCOUNTER — Other Ambulatory Visit: Payer: Self-pay

## 2020-01-02 ENCOUNTER — Ambulatory Visit (INDEPENDENT_AMBULATORY_CARE_PROVIDER_SITE_OTHER): Payer: Managed Care, Other (non HMO) | Admitting: Advanced Practice Midwife

## 2020-01-02 VITALS — BP 150/96 | HR 73 | Temp 98.4°F | Ht 64.0 in | Wt 149.0 lb

## 2020-01-02 DIAGNOSIS — Z3202 Encounter for pregnancy test, result negative: Secondary | ICD-10-CM | POA: Diagnosis not present

## 2020-01-02 DIAGNOSIS — Z01419 Encounter for gynecological examination (general) (routine) without abnormal findings: Secondary | ICD-10-CM | POA: Diagnosis present

## 2020-01-02 DIAGNOSIS — A6000 Herpesviral infection of urogenital system, unspecified: Secondary | ICD-10-CM

## 2020-01-02 DIAGNOSIS — Z30430 Encounter for insertion of intrauterine contraceptive device: Secondary | ICD-10-CM | POA: Diagnosis not present

## 2020-01-02 DIAGNOSIS — Z3009 Encounter for other general counseling and advice on contraception: Secondary | ICD-10-CM

## 2020-01-02 LAB — POCT URINE PREGNANCY: Preg Test, Ur: NEGATIVE

## 2020-01-02 MED ORDER — LEVONORGESTREL 19.5 MCG/DAY IU IUD: INTRAUTERINE_SYSTEM | Freq: Once | INTRAUTERINE | Status: AC

## 2020-01-02 NOTE — Patient Instructions (Signed)

## 2020-01-02 NOTE — Progress Notes (Signed)
Subjective:     Brooke Briggs is a 29 y.o. female here at [facility] for a routine exam.  Current complaints: some brown spotting in between periods last month only, desires contraception today.  Was on Depo but not currently.  Wants to wait a few years before having more children. Personal health questionnaire reviewed: yes.  Do you have a primary care provider? Yes  Depression screen [facility] 2/9 01/02/2020  Decreased Interest 0  Down, Depressed, Hopeless 0  PHQ - 2 Score 0      Gynecologic History Patient's last menstrual period was 12/06/2019. Contraception: none Last Pap: unknown. Results were: normal per pt Last mammogram: n/a.   Obstetric History OB History  Gravida Para Term Preterm AB Living  2 2 2     2   SAB TAB Ectopic Multiple Live Births        0 1    # Outcome Date GA Lbr Len/2nd Weight Sex Delivery Anes PTL Lv  2 Term 10/25/18 [redacted]w[redacted]d 01:32 / 00:11 6 lb 15.8 oz (3.17 kg) M Vag-Spont None  LIV  1 Term 01/02/16     Vag-Spont        The following portions of the patient's history were reviewed and updated as appropriate: allergies, current medications, past family history, past medical history, past social history, past surgical history and problem list.  Review of Systems A comprehensive review of systems was negative.    Objective:   BP (!) 150/96   Pulse 73   Temp 98.4 F (36.9 C)   Ht 5\' 4"  (1.626 m)   Wt 149 lb (67.6 kg)   LMP 12/06/2019   Breastfeeding No   BMI 25.58 kg/m    VS reviewed, nursing note reviewed,  Constitutional: well developed, well nourished, no distress HEENT: normocephalic CV: normal rate Pulm/chest wall: normal effort Breast Exam:  Deferred, discussed with pt, no close family hx, recommend mammogram at age 25 Abdomen: soft Neuro: alert and oriented x 3 Skin: warm, dry Psych: affect normal Pelvic exam: Cervix pink, visually closed, without lesion, scant white creamy discharge, vaginal walls and external genitalia  normal Bimanual exam: Cervix 0/long/high, firm, anterior, neg CMT, uterus nontender, nonenlarged, adnexa without tenderness, enlargement, or mass   IUD Procedure Note Patient identified, informed consent performed.  Discussed risks of irregular bleeding, cramping, infection, malpositioning or misplacement of the IUD outside the uterus which may require further procedures. Time out was performed.  Urine pregnancy test negative.  Speculum placed in the vagina.  Cervix visualized.  Cleaned with Betadine x 2.  Grasped anteriorly with a single tooth tenaculum.  Uterus sounded to 7.5 cm.  Liletta IUD placed per manufacturer's recommendations.  Strings trimmed to 3-4 cm. Tenaculum was removed, good hemostasis noted.  Patient tolerated procedure well.   Patient was given post-procedure instructions and the Liletta care card with expiration date.  Patient was also asked to check IUD strings periodically and follow up in 4-6 weeks for IUD check.  Lot # [id number] Exp 11/2022    Assessment/Plan:   1. Recurrent genital HSV (herpes simplex virus) infection --No recent outbreaks  2. Encounter for IUD insertion --UPT negative - POCT urine pregnancy --Pt LMP 3 weeks ago, next period due next week.  Pt with unprotected intercourse 2 weeks ago.  Discussed risks with pt of pregnancy loss, early in pregnancy, or later with IUD placement too early to detect pregnancy.  Risk of pregnancy low with negative pregnancy test 1 week prior to onset of menses.  Pt agrees to placement of IUD today vs waiting with potential risk of pregnancy.  3. Well woman exam with routine gynecological exam - Cytology - PAP( [facility])  4. Encounter for counseling regarding contraception Discussed LARCs as most effective forms of birth control.  Discussed benefits/risks of other methods.  Pt desires IUD today.She does not desire pregnancy for a few years. Liletta IUD placed without difficulty, see procedure note above.   Follow up  in: 4 weeks or as needed.   Sharen Counter, CNM 10:12 AM

## 2020-01-03 LAB — CYTOLOGY - PAP
Chlamydia: NEGATIVE
Comment: NEGATIVE
Comment: NORMAL
Diagnosis: NEGATIVE
Neisseria Gonorrhea: NEGATIVE

## 2020-01-30 ENCOUNTER — Ambulatory Visit: Payer: Managed Care, Other (non HMO) | Admitting: Advanced Practice Midwife

## 2020-06-19 ENCOUNTER — Ambulatory Visit: Payer: Managed Care, Other (non HMO) | Admitting: Obstetrics and Gynecology

## 2020-06-25 ENCOUNTER — Ambulatory Visit: Payer: Managed Care, Other (non HMO) | Admitting: Advanced Practice Midwife

## 2020-08-08 ENCOUNTER — Telehealth: Payer: Self-pay | Admitting: *Deleted

## 2020-08-08 NOTE — Telephone Encounter (Signed)
Left patient a message about appointment times open for tomorrow, 08/09/2020 as of this time today. Also, sent a MyChart message with information.

## 2020-08-09 ENCOUNTER — Other Ambulatory Visit: Payer: Self-pay

## 2020-08-09 ENCOUNTER — Ambulatory Visit (INDEPENDENT_AMBULATORY_CARE_PROVIDER_SITE_OTHER): Payer: Managed Care, Other (non HMO) | Admitting: Certified Nurse Midwife

## 2020-08-09 ENCOUNTER — Encounter: Payer: Self-pay | Admitting: Certified Nurse Midwife

## 2020-08-09 ENCOUNTER — Other Ambulatory Visit (HOSPITAL_COMMUNITY)
Admission: RE | Admit: 2020-08-09 | Discharge: 2020-08-09 | Disposition: A | Discharge status: Home / Self Care | Payer: Managed Care, Other (non HMO) | Source: Ambulatory Visit | Attending: Certified Nurse Midwife | Admitting: Certified Nurse Midwife

## 2020-08-09 VITALS — BP 149/105 | HR 89 | Ht 64.0 in | Wt 136.0 lb

## 2020-08-09 DIAGNOSIS — N898 Other specified noninflammatory disorders of vagina: Secondary | ICD-10-CM | POA: Diagnosis not present

## 2020-08-09 DIAGNOSIS — Z30432 Encounter for removal of intrauterine contraceptive device: Secondary | ICD-10-CM

## 2020-08-09 DIAGNOSIS — Z3042 Encounter for surveillance of injectable contraceptive: Secondary | ICD-10-CM | POA: Diagnosis not present

## 2020-08-09 DIAGNOSIS — I10 Essential (primary) hypertension: Secondary | ICD-10-CM | POA: Diagnosis not present

## 2020-08-09 MED ORDER — MEDROXYPROGESTERONE ACETATE 150 MG/ML IM SUSP: 150.0000 mg | INTRAMUSCULAR | 3 refills | Status: DC

## 2020-08-09 MED ORDER — MEDROXYPROGESTERONE ACETATE 150 MG/ML IM SUSP
150.0000 mg | Freq: Once | INTRAMUSCULAR | Status: AC
  Administered 2020-08-09: 150 mg via INTRAMUSCULAR

## 2020-08-09 NOTE — Progress Notes (Signed)
   GYNECOLOGY OFFICE VISIT NOTE  History:  29 y.o. [id number] here today for IUD removal. Liletta IUD was placed earlier this year. She is not having problems with the IUD but wants it out due to bad things she has heard. She is concerned about it causing blood clots, getting embedded, and causing infertility. She would like to go back on Depo Provera. She c/o vaginal malodor. No itching or discharge. No new partner. Declines STD testing.   Past Medical History:  Diagnosis Date  . Anxiety   . Depression   . HSV-2 infection   . Vaginal Pap smear, abnormal     History reviewed. No pertinent surgical history.  The following portions of the patient's history were reviewed and updated as appropriate: allergies, current medications, past family history, past medical history, past social history, past surgical history and problem list.   Health Maintenance:  Normal pap on 01/02/20.    Review of Systems:  Negative except noted in HPI  Objective:  Physical Exam BP (!) 149/105   Pulse 89   Ht 5\' 4"  (1.626 m)   Wt 136 lb (61.7 kg)   LMP 08/06/2020   BMI 23.34 kg/m  CONSTITUTIONAL: Well-developed, well-nourished female in no acute distress.  HENT:  Normocephalic, atraumatic EYES: Conjunctivae and EOM are normal NECK: Normal range of motion SKIN: Skin is warm and dry NEUROLOGIC: Alert and oriented to person, place, and time PSYCHIATRIC: Normal mood and affect CARDIOVASCULAR: Normal heart rate noted RESPIRATORY: Effort and rate normal ABDOMEN: Soft, no distention  PELVIC:Normal appearing external genitalia; normal appearing vaginal mucosa and cervix. Thin white frothy discharge present. MUSCULOSKELETAL: Normal range of motion  Procedure Note: Prior to procedure pt was counseled regarding risks with IUD and informed that IUDs safe and reliable forms of contraception, there is no increase risk of infertility or blood clots. Informed risk of malposition is low. Informed that progestin in  Depo Provera is similar to progestin in IUD. She wishes to proceed with removal. Pt consented for IUD removal. Pt placed in lithotomy position. Speculum inserted, cervix and IUD strings visualized. Strings grasped with long kelly and IUD removed intact without difficulty. Tolerated well.  Labs and Imaging No results found for this or any previous visit (from the past 24 hour(s)).  Assessment & Plan:   1. Vaginal odor   2. Encounter for management and injection of injectable progestin contraceptive   3. Encounter for IUD removal   4. Hypertension, unspecified type    Follow up with PCP next week for HTN Start Depo today- Rx sent Aptima swab Follow up GYN q3 mos for Depo  08/08/2020, CNM 08/09/2020 9:59 AM

## 2020-08-09 NOTE — Addendum Note (Signed)
Addended by: Kathie Dike on: 08/09/2020 10:32 AM   Modules accepted: Orders

## 2020-08-09 NOTE — Progress Notes (Signed)
First BP 149/105 Repeat BP 151/100 Pt has appt with PCP on 10/5 and will discuss BP

## 2020-08-12 LAB — CERVICOVAGINAL ANCILLARY ONLY
Bacterial Vaginitis (gardnerella): POSITIVE — AB
Candida Glabrata: NEGATIVE
Candida Vaginitis: NEGATIVE
Chlamydia: NEGATIVE
Comment: NEGATIVE
Comment: NEGATIVE
Comment: NEGATIVE
Comment: NEGATIVE
Comment: NEGATIVE
Comment: NORMAL
Neisseria Gonorrhea: NEGATIVE
Trichomonas: NEGATIVE

## 2020-08-13 ENCOUNTER — Other Ambulatory Visit: Payer: Self-pay | Admitting: Certified Nurse Midwife

## 2020-08-13 ENCOUNTER — Telehealth: Payer: Self-pay | Admitting: *Deleted

## 2020-08-13 DIAGNOSIS — B9689 Other specified bacterial agents as the cause of diseases classified elsewhere: Secondary | ICD-10-CM

## 2020-08-13 MED ORDER — METRONIDAZOLE 500 MG PO TABS: 500.0000 mg | ORAL_TABLET | Freq: Two times a day (BID) | ORAL | 0 refills | Status: DC

## 2020-08-13 NOTE — Telephone Encounter (Signed)
Pt sent note [facility] [place] [facility] [place].  [facility]st order cancelled and [facility].

## 2020-08-13 NOTE — Progress Notes (Signed)
+  BV, Rx Flagyl

## 2020-10-30 ENCOUNTER — Ambulatory Visit: Payer: Managed Care, Other (non HMO)

## 2020-10-30 ENCOUNTER — Telehealth: Payer: Self-pay | Admitting: *Deleted

## 2020-10-30 NOTE — Telephone Encounter (Signed)
Left patient a message to see if she is coming to get her Depo today or if she needs to reschedule before 11/08/2020.

## 2020-11-09 NOTE — L&D Delivery Note (Signed)
OB/GYN Faculty Practice Delivery Note  Sakia Schrimpf is a 30 y.o. [id number] s/p SVD at [redacted]w[redacted]d. She was admitted for IOL s/t CHTN.   ROM: 1h 57m with clear fluid GBS Status: Positive Maximum Maternal Temperature: 98.2  Labor Progress: Patient presented for induction and found to be 5cm.  She was started on pitocin and AROM performed.  She progressed rapidly to complete and delivered as below.   Delivery Date/Time: Oct 22, 2021 at 1323 Delivery: Called to room and patient pushing. After 20 minutes of pushing and reduction of cervical lip, head delivered in LOA position with restitution to LOT. No nuchal cord present, but compound right hand noted. Shoulder and body delivered in usual fashion. Infant with spontaneous cry, placed on mother's abdomen, dried and stimulated. Cord clamped x 2 after 2-minute delay, and cut by [facility]. Cord blood drawn. Placenta delivered spontaneously with gentle cord traction. Fundus firm with massage and Pitocin. Labia, perineum, vagina, and cervix inspected.  Cervix noted to be at introitus appears swollen with questionable hemostatic laceration. Dr. Alfonso Patten called to bedside and performed additional inspection with no significant findings noted.   Family wishes for infant to be circumcised during inpatient stay.  Mother and baby stable and skin-to-skin prior to provider exist.    Placenta: Shultz, Intact, To Disposal Complications: None Lacerations: None EBL: 200 Analgesia: None Birth Control: Depo  Infant: Female-Chance   APGARs 9, 9   3232g (7lbs 2oz ), Elvin So, CNM  10/22/2021 2:22 PM

## 2020-12-11 ENCOUNTER — Ambulatory Visit (INDEPENDENT_AMBULATORY_CARE_PROVIDER_SITE_OTHER): Payer: Managed Care, Other (non HMO) | Admitting: *Deleted

## 2020-12-11 ENCOUNTER — Other Ambulatory Visit: Payer: Self-pay

## 2020-12-11 DIAGNOSIS — Z3202 Encounter for pregnancy test, result negative: Secondary | ICD-10-CM | POA: Diagnosis not present

## 2020-12-11 DIAGNOSIS — Z3200 Encounter for pregnancy test, result unknown: Secondary | ICD-10-CM

## 2020-12-11 LAB — POCT URINE PREGNANCY: Preg Test, Ur: NEGATIVE

## 2020-12-11 NOTE — Progress Notes (Signed)
Pt is here for confirmation of pregnancy.  She has had 3 positive home tests that were faint.  She never came back for her 2nd Depo and doesn't remember when her last LMP was.  UPT today is negative but pt does request BHCG.  Will contact pt with results and f/u accordingly.

## 2020-12-12 LAB — HCG, QUANTITATIVE, PREGNANCY: HCG, Total, QN: <3 m[IU]/mL

## 2020-12-18 ENCOUNTER — Ambulatory Visit: Payer: Managed Care, Other (non HMO) | Admitting: Obstetrics and Gynecology

## 2021-03-06 DIAGNOSIS — F331 Major depressive disorder, recurrent, moderate: Secondary | ICD-10-CM | POA: Insufficient documentation

## 2021-03-25 ENCOUNTER — Telehealth: Payer: Self-pay | Admitting: *Deleted

## 2021-03-25 ENCOUNTER — Other Ambulatory Visit: Payer: Self-pay | Admitting: *Deleted

## 2021-03-25 MED ORDER — PROMETHAZINE HCL 25 MG PO TABS: 25.0000 mg | ORAL_TABLET | Freq: Four times a day (QID) | ORAL | 1 refills | Status: DC | PRN

## 2021-03-25 NOTE — Progress Notes (Signed)
Pt had called The Nurse triage after hours line and requested something for nausea due to pregnancy.  Triage nurse spoke with Dr Alysia Penna who Ok's a RX for Phenergan 25 mg to be sent to pharmacy.  [facility] [place].

## 2021-03-25 NOTE — Telephone Encounter (Signed)
Pt changed her mind and wanted her RX for [facility]ergan [facility] [place].  RX switched.

## 2021-04-01 ENCOUNTER — Encounter: Payer: Self-pay | Admitting: *Deleted

## 2021-04-01 NOTE — Progress Notes (Signed)
Last pap 2/21

## 2021-04-02 ENCOUNTER — Encounter: Payer: Self-pay | Admitting: Obstetrics and Gynecology

## 2021-04-02 ENCOUNTER — Ambulatory Visit (INDEPENDENT_AMBULATORY_CARE_PROVIDER_SITE_OTHER): Payer: Medicaid Other | Admitting: Obstetrics and Gynecology

## 2021-04-02 ENCOUNTER — Other Ambulatory Visit: Payer: Self-pay

## 2021-04-02 VITALS — Wt 136.0 lb

## 2021-04-02 DIAGNOSIS — Z3401 Encounter for supervision of normal first pregnancy, first trimester: Secondary | ICD-10-CM | POA: Diagnosis not present

## 2021-04-02 DIAGNOSIS — Z3400 Encounter for supervision of normal first pregnancy, unspecified trimester: Secondary | ICD-10-CM

## 2021-04-02 DIAGNOSIS — Z3A08 8 weeks gestation of pregnancy: Secondary | ICD-10-CM

## 2021-04-02 NOTE — Progress Notes (Signed)
DATING AND VIABILITY SONOGRAM   [place] Pensyl is a 30 y.o. year old G15P2002 with LMP No LMP recorded. Patient is pregnant. which would correlate to  [redacted]w[redacted]d weeks gestation.  She has regular menstrual cycles.   She is here today for a confirmatory initial sonogram.    GESTATION: SINGLETON-yes     FETAL ACTIVITY:          Heart rate         186          The fetus is active.          ADNEXA: The ovaries are normal.Lt CL   GESTATIONAL AGE AND  BIOMETRICS:  Gestational criteria: Estimated Date of Delivery: 11/09/21 by early ultrasound now at [redacted]w[redacted]d  Previous Scans:0      CROWN RUMP LENGTH           18.47mm         8 weeks 3 days                                                                               AVERAGE EGA(BY THIS SCAN):  8 weeks 3 days  WORKING EDD( early ultrasound ):  11/09/21     TECHNICIAN COMMENTS:  Single IUP with FHT of 186 and active.  Pt does have a Lt corpus luteal cyst.   A copy of this report including all images has been saved and backed up to a second source for retrieval if needed. All measures and details of the anatomical scan, placentation, fluid volume and pelvic anatomy are contained in that report.  Granville Lewis 04/02/2021 10:06 AM
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
## 2021-04-02 NOTE — Progress Notes (Signed)
PHQ 9: Score 5 GAD: Score 13 Currently in therapy

## 2021-04-03 DIAGNOSIS — O0990 Supervision of high risk pregnancy, unspecified, unspecified trimester: Secondary | ICD-10-CM | POA: Insufficient documentation

## 2021-04-03 DIAGNOSIS — Z3400 Encounter for supervision of normal first pregnancy, unspecified trimester: Secondary | ICD-10-CM | POA: Insufficient documentation

## 2021-04-03 LAB — GC/CHLAMYDIA PROBE AMP (~~LOC~~) NOT AT ARMC
Chlamydia: NEGATIVE
Comment: NEGATIVE
Comment: NORMAL
Neisseria Gonorrhea: NEGATIVE

## 2021-04-05 LAB — URINE CULTURE, OB REFLEX

## 2021-04-05 LAB — CULTURE, OB URINE

## 2021-04-08 ENCOUNTER — Other Ambulatory Visit: Payer: Self-pay | Admitting: Obstetrics and Gynecology

## 2021-04-08 MED ORDER — CEFADROXIL 500 MG PO CAPS: 500.0000 mg | ORAL_CAPSULE | Freq: Two times a day (BID) | ORAL | 0 refills | Status: DC

## 2021-04-21 ENCOUNTER — Other Ambulatory Visit: Payer: Self-pay | Admitting: Obstetrics and Gynecology

## 2021-04-21 MED ORDER — TERCONAZOLE 0.4 % VA CREA: 1.0000 | TOPICAL_CREAM | Freq: Every day | VAGINAL | 0 refills | Status: DC

## 2021-04-30 ENCOUNTER — Other Ambulatory Visit: Payer: Self-pay

## 2021-04-30 ENCOUNTER — Telehealth: Payer: Self-pay | Admitting: *Deleted

## 2021-04-30 ENCOUNTER — Ambulatory Visit (INDEPENDENT_AMBULATORY_CARE_PROVIDER_SITE_OTHER): Payer: Medicaid Other | Admitting: Obstetrics and Gynecology

## 2021-04-30 DIAGNOSIS — Z3A12 12 weeks gestation of pregnancy: Secondary | ICD-10-CM | POA: Diagnosis not present

## 2021-04-30 DIAGNOSIS — Z3401 Encounter for supervision of normal first pregnancy, first trimester: Secondary | ICD-10-CM

## 2021-04-30 DIAGNOSIS — R030 Elevated blood-pressure reading, without diagnosis of hypertension: Secondary | ICD-10-CM | POA: Insufficient documentation

## 2021-04-30 MED ORDER — DOCUSATE SODIUM 100 MG PO CAPS: 100.0000 mg | ORAL_CAPSULE | Freq: Two times a day (BID) | ORAL | 0 refills | Status: DC

## 2021-04-30 MED ORDER — ASPIRIN EC 81 MG PO TBEC: 81.0000 mg | DELAYED_RELEASE_TABLET | Freq: Every day | ORAL | 11 refills | Status: DC

## 2021-04-30 NOTE — Progress Notes (Signed)
   PRENATAL VISIT NOTE  Subjective:  Brooke Briggs is a 30 y.o. G3P2002 at [redacted]w[redacted]d being seen today for ongoing prenatal care.  She is currently monitored for the following issues for this low-risk pregnancy and has Recurrent genital HSV (herpes simplex virus) infection and Supervision of low-risk first pregnancy on their problem list.  Patient reports no complaints.   . Vag. Bleeding: None.  Movement: Absent. Denies leaking of fluid.   The following portions of the patient's history were reviewed and updated as appropriate: allergies, current medications, past family history, past medical history, past social history, past surgical history and problem list.   Objective:   Vitals:   04/30/21 1001  BP: (!) 141/88  Pulse: 88  Weight: 133 lb (60.3 kg)    Fetal Status: Fetal Heart Rate (bpm): 173   Movement: Absent     General:  Alert, oriented and cooperative. Patient is in no acute distress.  Skin: Skin is warm and dry. No rash noted.   Cardiovascular: Normal heart rate noted  Respiratory: Normal respiratory effort, no problems with respiration noted  Abdomen: Soft, gravid, appropriate for gestational age.  Pain/Pressure: Absent     Pelvic: Cervical exam deferred        Extremities: Normal range of motion.  Edema: None  Mental Status: Normal mood and affect. Normal behavior. Normal judgment and thought content.   Assessment and Plan:  Pregnancy: G3P2002 at 105w3d 1. Encounter for supervision of low-risk first pregnancy in first trimester  - BP elevated today, Recheck BP remains elevated. This the first time her BP has been elevated. Will bring her back Monday for BP check.  - start BASA daily  - [place] MFM OB DETAIL +14 WK; Future - Labs drawn today.  - Phone turned off and she was unable to schedule [facility] visit. Appointment scheduled now.  Preterm labor symptoms and general obstetric precautions including but not limited to vaginal bleeding, contractions, leaking of fluid and fetal  movement were reviewed in detail with the patient. Please refer to After Visit Summary for other counseling recommendations.   Return in about 4 weeks (around 05/28/2021).  Future Appointments  Date Time Provider Department Center  05/15/2021 10:15 AM [facility] HEALTH CLINICIAN [facility]  05/28/2021  9:10 AM Andrey Hoobler, Harolyn Rutherford, NP [facility]  06/17/2021 10:30 AM [facility] NURSE [facility]  06/17/2021 10:45 AM [facility] US5 [facility]    Venia Carbon, NP

## 2021-04-30 NOTE — Telephone Encounter (Signed)
Called patient twice to see if she was going to be able to make it by 9:45 AM for her 9:30 AM appointment, no answer or voicemail to leave a message.

## 2021-04-30 NOTE — Progress Notes (Signed)
BP recheck 149/106.  Per J Rash,NP pt to return on Monday for BP check

## 2021-04-30 NOTE — Progress Notes (Deleted)
   PRENATAL VISIT NOTE  Subjective:  Brooke Briggs is a 30 y.o. G3P2002 at [redacted]w[redacted]d being seen today for ongoing prenatal care.  She is currently monitored for the following issues for this {Blank single:19197::"high-risk","low-risk"} pregnancy and has Recurrent genital HSV (herpes simplex virus) infection and Supervision of low-risk first pregnancy on their problem list.  Patient reports {sx:14538}.   . Vag. Bleeding: None.  Movement: Absent. Denies leaking of fluid.   The following portions of the patient's history were reviewed and updated as appropriate: allergies, current medications, past family history, past medical history, past social history, past surgical history and problem list.   Objective:   Vitals:   04/30/21 1001  BP: (!) 141/88  Pulse: 88  Weight: 133 lb (60.3 kg)    Fetal Status: Fetal Heart Rate (bpm): 173   Movement: Absent     General:  Alert, oriented and cooperative. Patient is in no acute distress.  Skin: Skin is warm and dry. No rash noted.   Cardiovascular: Normal heart rate noted  Respiratory: Normal respiratory effort, no problems with respiration noted  Abdomen: Soft, gravid, appropriate for gestational age.  Pain/Pressure: Absent     Pelvic: {Blank single:19197::"Cervical exam performed in the presence of a chaperone","Cervical exam deferred"}        Extremities: Normal range of motion.  Edema: None  Mental Status: Normal mood and affect. Normal behavior. Normal judgment and thought content.   Assessment and Plan:  Pregnancy: G3P2002 at [redacted]w[redacted]d 1. Encounter for supervision of low-risk first pregnancy in first trimester   {Blank single:19197::"Term","Preterm"} labor symptoms and general obstetric precautions including but not limited to vaginal bleeding, contractions, leaking of fluid and fetal movement were reviewed in detail with the patient. Please refer to After Visit Summary for other counseling recommendations.   No follow-ups on file.  No future  appointments.  Venia Carbon, NP

## 2021-05-01 LAB — OBSTETRIC PANEL
Absolute Monocytes: 599 cells/uL (ref 200–950)
Antibody Screen: NOT DETECTED
Basophils Absolute: 32 cells/uL (ref 0–200)
Basophils Relative: 0.3 %
Eosinophils Absolute: 53 cells/uL (ref 15–500)
Eosinophils Relative: 0.5 %
HCT: 38 % (ref 35.0–45.0)
Hemoglobin: 12.4 g/dL (ref 11.7–15.5)
Hepatitis B Surface Ag: NONREACTIVE
Lymphs Abs: 1985 cells/uL (ref 850–3900)
MCH: 26.8 pg — ABNORMAL LOW (ref 27.0–33.0)
MCHC: 32.6 g/dL (ref 32.0–36.0)
MCV: 82.1 fL (ref 80.0–100.0)
MPV: 11.7 fL (ref 7.5–12.5)
Monocytes Relative: 5.7 %
Neutro Abs: 7833 cells/uL — ABNORMAL HIGH (ref 1500–7800)
Neutrophils Relative %: 74.6 %
Platelets: 338 10*3/uL (ref 140–400)
RBC: 4.63 10*6/uL (ref 3.80–5.10)
RDW: 13.1 % (ref 11.0–15.0)
RPR Ser Ql: NONREACTIVE
Rubella: 2.17 Index
Total Lymphocyte: 18.9 %
WBC: 10.5 10*3/uL (ref 3.8–10.8)

## 2021-05-01 LAB — HIV ANTIBODY (ROUTINE TESTING W REFLEX): HIV 1&2 Ab, 4th Generation: NONREACTIVE

## 2021-05-01 LAB — HEPATITIS C ANTIBODY
Hepatitis C Ab: NONREACTIVE
SIGNAL TO CUT-OFF: 0.01 (ref <1.00)

## 2021-05-01 NOTE — BH Specialist Note (Addendum)
Pt did not arrive to video visit and did not answer the phone ; Pt's phone says out of service; Left HIPPA-compliant message to call back Asher Muir from Center for [name] at [facility] at  [phone] [facility] office) at alternate number.  ; left MyChart message for patient.

## 2021-05-05 ENCOUNTER — Other Ambulatory Visit: Payer: Self-pay

## 2021-05-05 ENCOUNTER — Ambulatory Visit (INDEPENDENT_AMBULATORY_CARE_PROVIDER_SITE_OTHER): Payer: Medicaid Other

## 2021-05-05 VITALS — BP 132/84

## 2021-05-05 DIAGNOSIS — Z3401 Encounter for supervision of normal first pregnancy, first trimester: Secondary | ICD-10-CM

## 2021-05-05 DIAGNOSIS — Z3A12 12 weeks gestation of pregnancy: Secondary | ICD-10-CM

## 2021-05-05 DIAGNOSIS — R030 Elevated blood-pressure reading, without diagnosis of hypertension: Secondary | ICD-10-CM

## 2021-05-05 DIAGNOSIS — Z3481 Encounter for supervision of other normal pregnancy, first trimester: Secondary | ICD-10-CM

## 2021-05-05 DIAGNOSIS — Z0130 Encounter for examination of blood pressure without abnormal findings: Secondary | ICD-10-CM

## 2021-05-05 NOTE — Progress Notes (Signed)
Pt here for BP Check. Pt has no complaints today. BP is 132/84. Pt is aware to call the office if BP increases. Pt will return to office for OB appt on 05/28/21.

## 2021-05-09 DIAGNOSIS — Z3401 Encounter for supervision of normal first pregnancy, first trimester: Secondary | ICD-10-CM

## 2021-05-13 ENCOUNTER — Encounter: Payer: Self-pay | Admitting: *Deleted

## 2021-05-13 DIAGNOSIS — Z3401 Encounter for supervision of normal first pregnancy, first trimester: Secondary | ICD-10-CM

## 2021-05-15 ENCOUNTER — Ambulatory Visit: Payer: Medicaid Other | Admitting: Clinical

## 2021-05-27 ENCOUNTER — Telehealth: Payer: Self-pay | Admitting: Clinical

## 2021-05-27 NOTE — Telephone Encounter (Signed)
Attempt to contact pt to reschedule appointment, unable to leave voice message as call not complete. Pt has direct number to call [facility] to schedule appointment at [phone], as well as front office number at [phone].

## 2021-05-28 ENCOUNTER — Telehealth: Payer: Self-pay | Admitting: *Deleted

## 2021-05-28 ENCOUNTER — Encounter: Payer: Medicaid Other | Admitting: Obstetrics and Gynecology

## 2021-05-28 NOTE — Telephone Encounter (Signed)
Left patient a message at [phone], new number in Epic to call and reschedule missed OB appointment on 05/28/2021.

## 2021-06-17 ENCOUNTER — Encounter: Payer: Self-pay | Admitting: *Deleted

## 2021-06-17 ENCOUNTER — Ambulatory Visit: Payer: Medicaid Other | Attending: Obstetrics and Gynecology

## 2021-06-17 ENCOUNTER — Other Ambulatory Visit: Payer: Self-pay | Admitting: *Deleted

## 2021-06-17 ENCOUNTER — Other Ambulatory Visit: Payer: Self-pay

## 2021-06-17 ENCOUNTER — Ambulatory Visit: Payer: Medicaid Other | Admitting: *Deleted

## 2021-06-17 VITALS — BP 124/75 | HR 80

## 2021-06-17 DIAGNOSIS — R030 Elevated blood-pressure reading, without diagnosis of hypertension: Secondary | ICD-10-CM

## 2021-06-17 DIAGNOSIS — Z3401 Encounter for supervision of normal first pregnancy, first trimester: Secondary | ICD-10-CM | POA: Insufficient documentation

## 2021-06-17 DIAGNOSIS — Z362 Encounter for other antenatal screening follow-up: Secondary | ICD-10-CM

## 2021-06-17 DIAGNOSIS — Z3492 Encounter for supervision of normal pregnancy, unspecified, second trimester: Secondary | ICD-10-CM

## 2021-06-17 DIAGNOSIS — O169 Unspecified maternal hypertension, unspecified trimester: Secondary | ICD-10-CM

## 2021-06-23 ENCOUNTER — Telehealth: Payer: Self-pay | Admitting: Women's Health

## 2021-06-23 NOTE — Telephone Encounter (Signed)
Brooke Briggs w/[facility] called to make appt for pt - is an OB transfer from [facility] for [name])  Pt has transportation issues & needs to continue care here  Please review records & advise on scheduling needs - Pt only communicates through MyChart due to not having a working phone

## 2021-06-24 NOTE — Telephone Encounter (Signed)
Scheduled & sent pt a MyChart message to nofity

## 2021-06-30 ENCOUNTER — Other Ambulatory Visit: Payer: Self-pay

## 2021-07-07 NOTE — BH Specialist Note (Signed)
Pt did not arrive to video visit and did not answer the phone; Left HIPPA-compliant message to call back Kyaire Gruenewald from [facility] at  [phone] (Kinda Pottle's office).  ?; left MyChart message for patient.  ? ?

## 2021-07-15 ENCOUNTER — Other Ambulatory Visit: Payer: Self-pay | Admitting: Women's Health

## 2021-07-15 DIAGNOSIS — Z362 Encounter for other antenatal screening follow-up: Secondary | ICD-10-CM

## 2021-07-16 ENCOUNTER — Ambulatory Visit: Payer: Medicaid Other

## 2021-07-17 ENCOUNTER — Ambulatory Visit (INDEPENDENT_AMBULATORY_CARE_PROVIDER_SITE_OTHER): Payer: Medicaid Other

## 2021-07-17 ENCOUNTER — Encounter: Payer: Self-pay | Admitting: Advanced Practice Midwife

## 2021-07-17 ENCOUNTER — Other Ambulatory Visit: Payer: Self-pay

## 2021-07-17 ENCOUNTER — Ambulatory Visit (INDEPENDENT_AMBULATORY_CARE_PROVIDER_SITE_OTHER): Payer: Medicaid Other | Admitting: Advanced Practice Midwife

## 2021-07-17 VITALS — BP 152/94 | HR 101 | Wt 149.0 lb

## 2021-07-17 DIAGNOSIS — O0992 Supervision of high risk pregnancy, unspecified, second trimester: Secondary | ICD-10-CM

## 2021-07-17 DIAGNOSIS — Z362 Encounter for other antenatal screening follow-up: Secondary | ICD-10-CM | POA: Diagnosis not present

## 2021-07-17 DIAGNOSIS — R030 Elevated blood-pressure reading, without diagnosis of hypertension: Secondary | ICD-10-CM | POA: Diagnosis not present

## 2021-07-17 DIAGNOSIS — O10012 Pre-existing essential hypertension complicating pregnancy, second trimester: Secondary | ICD-10-CM

## 2021-07-17 DIAGNOSIS — Z3A25 25 weeks gestation of pregnancy: Secondary | ICD-10-CM | POA: Diagnosis not present

## 2021-07-17 DIAGNOSIS — Z3402 Encounter for supervision of normal first pregnancy, second trimester: Secondary | ICD-10-CM | POA: Diagnosis not present

## 2021-07-17 MED ORDER — BLOOD PRESSURE MONITOR AUTOMAT DEVI: 0 refills | Status: DC

## 2021-07-17 MED ORDER — LABETALOL HCL 200 MG PO TABS: 200.0000 mg | ORAL_TABLET | Freq: Two times a day (BID) | ORAL | 3 refills | Status: DC

## 2021-07-17 NOTE — Progress Notes (Signed)
Clear, stringy discharge

## 2021-07-17 NOTE — Progress Notes (Addendum)
[place] 25+1 wks,breech,cx 3 cm,normal ovaries,anterior placenta gr 0,SVP of fluid 6.8 cm,fhr 159 bpm,EFW 780g 42%,limited view of the spine because of fetal position,recheck on next scan,no obvious abnormalities

## 2021-07-17 NOTE — Patient Instructions (Signed)

## 2021-07-17 NOTE — Progress Notes (Signed)
HIGH-RISK PREGNANCY VISIT Patient name: Brooke Briggs MRN [id number]  Date of birth: 04/11/1991 Chief Complaint:   Routine Prenatal Visit ([place] today)  History of Present Illness:   Brooke Briggs is a 30 y.o. G14P2002 female at [redacted]w[redacted]d with an Estimated Date of Delivery: 10/29/21 being seen today for ongoing management of a high-risk pregnancy complicated by [facility], officially dx today.  Chart review of BPs reveals several elevated BPs (outside of pg) last year, in addition to several this pregnancy (including first trimester).  Discussed w/[facility], agrees w/dx.   Today she reports no complaints. Contractions: Not present. Vag. Bleeding: None.  Movement: Present. denies leaking of fluid.  Review of Systems:   Pertinent items are noted in HPI Denies abnormal vaginal discharge w/ itching/odor/irritation, headaches, visual changes, shortness of breath, chest pain, abdominal pain, severe nausea/vomiting, or problems with urination or bowel movements unless otherwise stated above. Pertinent History Reviewed:  Reviewed past medical,surgical, social, obstetrical and family history.  Reviewed problem list, medications and allergies. Physical Assessment:   Vitals:   07/17/21 1435  BP: (!) 152/94  Pulse: (!) 101  Weight: 149 lb (67.6 kg)  Body mass index is 25.58 kg/m.           Physical Examination:   General appearance: alert, well appearing, and in no distress  Mental status: alert, oriented to person, place, and time  Skin: warm & dry   Extremities: Edema: None    Cardiovascular: normal heart rate noted  Respiratory: normal respiratory effort, no distress  Abdomen: gravid, soft, non-tender  Pelvic: Cervical exam deferred         Fetal Status:     Movement: Present    Fetal Surveillance Testing today: [place] 25+1 wks,breech,cx 3 cm,normal ovaries,anterior placenta gr 0,SVP of fluid 6.8 cm,fhr 159 bpm,EFW 780g 42%,limited view of the spine because of fetal position,recheck on next scan,no obvious  abnormalities    No results found for this or any previous visit (from the past 24 hour(s)).  Assessment & Plan:  1) High-risk pregnancy G3P2002 at [redacted]w[redacted]d with an Estimated Date of Delivery: 10/29/21   2) CHTN, unstable Treatment plan  Start labetalol 200mg  BID today, continue ASA.  Growth scans q 4 weeks, twice weekly testing 32, IOL 38-39 weeks  3) HSV,   Treatment plan--ppx at 34 weeks  Meds:  Meds ordered this encounter  Medications   labetalol (NORMODYNE) 200 MG tablet    Sig: Take 1 tablet (200 mg total) by mouth 2 (two) times daily.    Dispense:  60 tablet    Refill:  3    Order Specific Question:   Supervising Provider    Answer:   H [[id number]]   Blood Pressure Monitoring (BLOOD PRESSURE MONITOR AUTOMAT) DEVI    Sig: Take BP at home daily.  Alert Duane Lope if >140/90 more than once.    Dispense:  1 each    Refill:  0    Order Specific Question:   Supervising Provider    Answer:   [place] [[id number]]    Labs/procedures today: Anatomy scan, incomplete views of spine.  Will reasses at next [facility]   Reviewed: Preterm labor symptoms and general obstetric precautions including but not limited to vaginal bleeding, contractions, leaking of fluid and fetal movement were reviewed in detail with the patient.  All questions were answered. Doesn't have a home bp cuff. Rx faxe. Check bp weekly, let us know if >140/90.   Follow-up: Return for Tues or Weds for  BP check w/RN; 3 weeks EFW/us/PN2/HROB.  Future Appointments  Date Time Provider Department Center  07/18/2021 10:15 AM [facility] HEALTH CLINICIAN [facility]  07/31/2021 11:15 AM [facility] NURSE [facility]  07/31/2021 11:30 AM [facility] US3 [facility]    No orders of the defined types were placed in this encounter.  Jacklyn Shell DNP, CNM 07/17/2021 3:18 PM

## 2021-07-18 ENCOUNTER — Ambulatory Visit: Payer: Medicaid Other | Admitting: Clinical

## 2021-07-18 DIAGNOSIS — Z91199 Patient's noncompliance with other medical treatment and regimen due to unspecified reason: Secondary | ICD-10-CM

## 2021-07-18 DIAGNOSIS — Z5329 Procedure and treatment not carried out because of patient's decision for other reasons: Secondary | ICD-10-CM

## 2021-07-23 ENCOUNTER — Ambulatory Visit (INDEPENDENT_AMBULATORY_CARE_PROVIDER_SITE_OTHER): Payer: Medicaid Other

## 2021-07-23 ENCOUNTER — Other Ambulatory Visit: Payer: Self-pay

## 2021-07-23 VITALS — BP 125/82 | HR 96 | Wt 148.0 lb

## 2021-07-23 DIAGNOSIS — O10012 Pre-existing essential hypertension complicating pregnancy, second trimester: Secondary | ICD-10-CM

## 2021-07-23 DIAGNOSIS — Z0130 Encounter for examination of blood pressure without abnormal findings: Secondary | ICD-10-CM

## 2021-07-23 DIAGNOSIS — R3989 Other symptoms and signs involving the genitourinary system: Secondary | ICD-10-CM

## 2021-07-23 DIAGNOSIS — R399 Unspecified symptoms and signs involving the genitourinary system: Secondary | ICD-10-CM

## 2021-07-23 DIAGNOSIS — O0992 Supervision of high risk pregnancy, unspecified, second trimester: Secondary | ICD-10-CM

## 2021-07-23 LAB — POCT URINALYSIS DIPSTICK OB
Glucose, UA: NEGATIVE
Ketones, UA: NEGATIVE
Nitrite, UA: POSITIVE

## 2021-07-23 MED ORDER — CEPHALEXIN 500 MG PO CAPS: 500.0000 mg | ORAL_CAPSULE | Freq: Two times a day (BID) | ORAL | 0 refills | Status: AC

## 2021-07-23 NOTE — Addendum Note (Signed)
Addended by: Sharon Seller on: 07/23/2021 02:18 PM   Modules accepted: Orders

## 2021-07-23 NOTE — Progress Notes (Addendum)
   NURSE VISIT- BLOOD PRESSURE CHECK  SUBJECTIVE:  Brooke Briggs is a 30 y.o. G67P2002 female here for BP check. She is [redacted]w[redacted]d pregnant    HYPERTENSION ROS:  Pregnant:  Severe headaches that don't go away with tylenol/other medicines:  Pt goes to sleep and they go away, or just go away on their own Visual changes (seeing spots/double/blurred vision) Yes  Severe pain under right breast breast or in center of upper chest No  Severe nausea/vomiting No  Taking medicines as instructed yes  OBJECTIVE:  BP 125/82   Pulse 96   Wt 148 lb (67.1 kg)   BMI 25.40 kg/m   Appearance alert, well appearing, and in no distress, oriented to person, place, and time, and normal appearing weight.  ASSESSMENT: Pregnancy [redacted]w[redacted]d  blood pressure check  PLAN: Discussed with Dr. Charlotta Newton   Recommendations:  No changes with current med, start Aspirin as prev directed    Follow-up:  1 week if still symptomatic      NURSE VISIT- UTI SYMPTOMS   SUBJECTIVE:  Brooke Briggs is a 30 y.o. G56P2002 female here for UTI symptoms. She is [redacted]w[redacted]d pregnant. She reports lower abdominal pain and odor .  OBJECTIVE:  BP 125/82   Pulse 96   Wt 148 lb (67.1 kg)   BMI 25.40 kg/m   Appears well, in no apparent distress  Results for orders placed or performed in visit on 07/23/21 (from the past 24 hour(s))  POC Urinalysis Dipstick OB   Collection Time: 07/23/21 10:16 AM  Result Value Ref Range   Color, UA     Clarity, UA     Glucose, UA Negative Negative   Bilirubin, UA     Ketones, UA neg    Spec Grav, UA     Blood, UA trace    pH, UA     POC,PROTEIN,UA Trace Negative, Trace, Small (1+), Moderate (2+), Large (3+), 4+   Urobilinogen, UA     Nitrite, UA pos    Leukocytes, UA Moderate (2+) (A) Negative   Appearance     Odor      ASSESSMENT: Pregnancy [redacted]w[redacted]d with UTI symptoms and positive nitrites  PLAN: Discussed with Dr. Charlotta Newton   Rx sent by provider today: Yes Urine culture sent Call or return to clinic prn if  these symptoms worsen or fail to improve as anticipated. Follow-up: as scheduled   Manfred Laspina A Amreen Raczkowski  07/23/2021 10:30 AM   Reviewed with Lawanna Kobus- concern for UTI based on UA and clinical symptoms.  Rx sent in and plan to follow urine culture to confirm susceptibility.  Pt to call if no improvement or worsening of her other symptoms.  Chart reviewed for nurse visit. Agree with plan of care.  Myna Hidalgo, DO 07/23/2021 2:18 PM

## 2021-07-29 LAB — URINE CULTURE

## 2021-07-31 ENCOUNTER — Ambulatory Visit: Payer: Medicaid Other

## 2021-08-04 ENCOUNTER — Other Ambulatory Visit: Payer: Self-pay | Admitting: Obstetrics & Gynecology

## 2021-08-04 MED ORDER — CEPHALEXIN 500 MG PO CAPS: 500.0000 mg | ORAL_CAPSULE | Freq: Two times a day (BID) | ORAL | 0 refills | Status: AC

## 2021-08-04 NOTE — Progress Notes (Signed)
Prescription sent in for UTI °

## 2021-08-08 ENCOUNTER — Other Ambulatory Visit: Payer: Self-pay | Admitting: Advanced Practice Midwife

## 2021-08-08 DIAGNOSIS — O10919 Unspecified pre-existing hypertension complicating pregnancy, unspecified trimester: Secondary | ICD-10-CM

## 2021-08-11 ENCOUNTER — Other Ambulatory Visit: Payer: Self-pay

## 2021-08-11 ENCOUNTER — Other Ambulatory Visit: Payer: Medicaid Other

## 2021-08-11 ENCOUNTER — Encounter: Payer: Self-pay | Admitting: Women's Health

## 2021-08-11 ENCOUNTER — Ambulatory Visit (INDEPENDENT_AMBULATORY_CARE_PROVIDER_SITE_OTHER): Payer: Medicaid Other

## 2021-08-11 ENCOUNTER — Ambulatory Visit (INDEPENDENT_AMBULATORY_CARE_PROVIDER_SITE_OTHER): Payer: Medicaid Other | Admitting: Women's Health

## 2021-08-11 VITALS — BP 117/79 | HR 93 | Wt 151.8 lb

## 2021-08-11 DIAGNOSIS — O0992 Supervision of high risk pregnancy, unspecified, second trimester: Secondary | ICD-10-CM

## 2021-08-11 DIAGNOSIS — O10919 Unspecified pre-existing hypertension complicating pregnancy, unspecified trimester: Secondary | ICD-10-CM | POA: Diagnosis not present

## 2021-08-11 DIAGNOSIS — Z3A28 28 weeks gestation of pregnancy: Secondary | ICD-10-CM

## 2021-08-11 DIAGNOSIS — O0993 Supervision of high risk pregnancy, unspecified, third trimester: Secondary | ICD-10-CM

## 2021-08-11 DIAGNOSIS — O10012 Pre-existing essential hypertension complicating pregnancy, second trimester: Secondary | ICD-10-CM

## 2021-08-11 DIAGNOSIS — Z1389 Encounter for screening for other disorder: Secondary | ICD-10-CM

## 2021-08-11 LAB — POCT URINALYSIS DIPSTICK OB
Blood, UA: NEGATIVE
Glucose, UA: NEGATIVE
Ketones, UA: NEGATIVE
Nitrite, UA: POSITIVE
POC,PROTEIN,UA: NEGATIVE

## 2021-08-11 NOTE — Progress Notes (Signed)
HIGH-RISK PREGNANCY VISIT Patient name: Brooke Briggs MRN [id number]  Date of birth: 12/04/1990 Chief Complaint:   Routine Prenatal Visit, High Risk Gestation, and Pregnancy Ultrasound  History of Present Illness:   Brooke Briggs is a 30 y.o. G25P2002 female at [redacted]w[redacted]d with an Estimated Date of Delivery: 10/29/21 being seen today for ongoing management of a high-risk pregnancy complicated by chronic hypertension currently on Labetalol 200mg  BID.    Today she reports no complaints. Didn't know she had rx for UTI at pharmacy. Contractions: Irritability. Vag. Bleeding: None.  Movement: Present. denies leaking of fluid.   Depression screen [facility] 2/9 08/11/2021 04/02/2021 01/02/2020  Decreased Interest 0 2 0  Down, Depressed, Hopeless 0 0 0  PHQ - 2 Score 0 2 0  Altered sleeping 0 2 -  Tired, decreased energy 0 0 -  Change in appetite 0 1 -  Feeling bad or failure about yourself  0 0 -  Trouble concentrating 0 0 -  Moving slowly or fidgety/restless 0 0 -  Suicidal thoughts 0 0 -  PHQ-9 Score 0 5 -     GAD 7 : Generalized Anxiety Score 08/11/2021 04/02/2021  Nervous, Anxious, on Edge 0 3  Control/stop worrying 0 2  Worry too much - different things 0 2  Trouble relaxing 0 2  Restless 0 0  Easily annoyed or irritable 0 2  Afraid - awful might happen 0 2  Total GAD 7 Score 0 13     Review of Systems:   Pertinent items are noted in HPI Denies abnormal vaginal discharge w/ itching/odor/irritation, headaches, visual changes, shortness of breath, chest pain, abdominal pain, severe nausea/vomiting, or problems with urination or bowel movements unless otherwise stated above. Pertinent History Reviewed:  Reviewed past medical,surgical, social, obstetrical and family history.  Reviewed problem list, medications and allergies. Physical Assessment:   Vitals:   08/11/21 1107  BP: 117/79  Pulse: 93  Weight: 151 lb 12.8 oz (68.9 kg)  Body mass index is 26.06 kg/m.           Physical Examination:    General appearance: alert, well appearing, and in no distress  Mental status: alert, oriented to person, place, and time  Skin: warm & dry   Extremities: Edema: None    Cardiovascular: normal heart rate noted  Respiratory: normal respiratory effort, no distress  Abdomen: gravid, soft, non-tender  Pelvic: Cervical exam deferred         Fetal Status: Fetal Heart Rate (bpm): 157 u/s   Movement: Present    Fetal Surveillance Testing today: 10/11/21 28+5 wks,cephalic,anterior placenta gr 1,afi 11.7 cm,FHR 157 BPM,CX 2.9 CM,EFW 1334 g 51%  Chaperone: N/A    Results for orders placed or performed in visit on 08/11/21 (from the past 24 hour(s))  POC Urinalysis Dipstick OB   Collection Time: 08/11/21 11:20 AM  Result Value Ref Range   Color, UA     Clarity, UA     Glucose, UA Negative Negative   Bilirubin, UA     Ketones, UA neg    Spec Grav, UA     Blood, UA neg    pH, UA     POC,PROTEIN,UA Negative Negative, Trace, Small (1+), Moderate (2+), Large (3+), 4+   Urobilinogen, UA     Nitrite, UA pos    Leukocytes, UA Small (1+) (A) Negative   Appearance     Odor      Assessment & Plan:  High-risk pregnancy: 10/11/21 at [redacted]w[redacted]d with an Estimated  Date of Delivery: 10/29/21   1) CHTN, stable, labetalol 200mg  BID, ASA (hasn't started)  2) ASB, didn't know had rx at pharmacy, pick up and start today  Meds: No orders of the defined types were placed in this encounter.   Labs/procedures today: U/S, plans flu & tdap at next visit  Treatment Plan:  Growth u/s q 4wks    2x/wk testing nst/sono @ 32wks     Deliver 38-39wks (37wks or prn if poor control)____   Reviewed: Preterm labor symptoms and general obstetric precautions including but not limited to vaginal bleeding, contractions, leaking of fluid and fetal movement were reviewed in detail with the patient.  All questions were answered.   Follow-up: Return in about 4 weeks (around 09/08/2021) for HROB, 09/10/2021: EFW/BPP/dopp, MD or CNM, in person  on Mon, Thurs nst/nurse til 39wks.   Future Appointments  Date Time Provider Department Center  09/08/2021 10:30 AM [facility] NURSE [facility]  09/11/2021  9:30 AM [facility] 13/01/2021 [facility] None  09/11/2021 10:30 AM 13/01/2021, CNM [facility]  09/15/2021 10:30 AM [facility] NURSE [facility]  09/18/2021  9:00 AM [facility] 13/08/2021 [facility] None  09/18/2021  9:50 AM 13/08/2021, CNM [facility]  09/22/2021 10:30 AM [facility] NURSE [facility]  09/25/2021  9:00 AM [facility] 09/27/2021 [facility] None  09/25/2021  9:50 AM 09/27/2021, CNM [facility]  09/29/2021 10:30 AM [facility] NURSE [facility]  10/06/2021 10:10 AM [facility] NURSE [facility]  10/09/2021  9:00 AM [facility] 14/11/2020 [facility] None  10/09/2021  9:50 AM 14/11/2020, DO [facility]  10/13/2021 10:30 AM [facility] NURSE [facility]  10/16/2021  9:00 AM [facility] 14/06/2021 [facility] None  10/16/2021  9:50 AM 14/06/2021, CNM [facility]  10/20/2021 10:30 AM [facility] NURSE [facility]    Orders Placed This Encounter  Procedures   14/10/2021 OB Follow Up   US FETAL BPP WO NON STRESS   [place] UA Cord Doppler   POC Urinalysis Dipstick OB   US CNM, [facility] 08/11/2021 1:43 PM

## 2021-08-11 NOTE — Progress Notes (Signed)
[place] 28+5 wks,cephalic,anterior placenta gr 1,afi 11.7 cm,FHR 157 BPM,CX 2.9 CM,EFW 1334 g 51%

## 2021-08-11 NOTE — Patient Instructions (Signed)
Ardean Larsen, thank you for choosing our office today! We appreciate the opportunity to meet your healthcare needs. You may receive a short survey by mail, e-mail, or through [employer]. If you are happy with your care we would appreciate if you could take just a few minutes to complete the survey questions. We read all of your comments and take your feedback very seriously. Thank you again for choosing our office.  Center for [name] Team at [facility]  [facility] at [facility] ([address] [place], [place] [address]) Entrance C, located off of [address] Free 24/7 valet parking   CLASSES: Go to [email or web address] to register for classes (childbirth, breastfeeding, waterbirth, infant CPR, daddy bootcamp, etc.)  Call the office [phone]) or go to [facility] if: You begin to have strong, frequent contractions Your water breaks.  Sometimes it is a big gush of fluid, sometimes it is just a trickle that keeps getting your panties wet or running down your legs You have vaginal bleeding.  It is normal to have a small amount of spotting if your cervix was checked.  You don't feel your baby moving like normal.  If you don't, get you something to eat and drink and lay down and focus on feeling your baby move.   If your baby is still not moving like normal, you should call the office or go to [facility]  Call the office [phone]) or go to [facility] hospital for these signs of pre-eclampsia: Severe headache that does not go away with Tylenol Visual changes- seeing spots, double, blurred vision Pain under your right breast or upper abdomen that does not go away with Tums or heartburn medicine Nausea and/or vomiting Severe swelling in your hands, feet, and face   Tdap Vaccine It is recommended that you get the Tdap vaccine during the third trimester of EACH pregnancy to help protect your baby from getting pertussis (whooping cough) 27-36 weeks is the BEST time to do  this so that you can pass the protection on to your baby. During pregnancy is better than after pregnancy, but if you are unable to get it during pregnancy it will be offered at the hospital.  You can get this vaccine with [place], at the health department, your family doctor, or some local pharmacies Everyone who will be around your baby should also be up-to-date on their vaccines before the baby comes. Adults (who are not pregnant) only need 1 dose of Tdap during adulthood.   [facility] Pediatricians/Family Doctors [facility]): [address] Colette Ribas, [phone]           [facility]: [address], [phone]                [facility]): [address], [phone] (call to ask if accepting patients) [facility] Department: [address], [place], [phone]    [facility] Pediatricians/Family Doctors [facility]): [phone] [address], [phone] [facility]: [address] [place], [phone] [facility]: [address], [phone]  [facility] Doctors  [facility]): [phone] [facility]: [address] [phone]   [facility] Doctors [facility]: [address], [phone]  [facility] Family Doctors  [facility]: [phone], [phone]  Home Blood Pressure Monitoring for Patients   Your provider has recommended that you check your  blood pressure (BP) at least once a week at home. If you do not have a blood pressure cuff at home, one will be provided for you. Contact your provider if you have not received your monitor within 1 week.   Helpful Tips for Accurate Home Blood Pressure Checks  Don't smoke, exercise, or drink caffeine 30 minutes before checking your BP Use the restroom before checking your BP (a full bladder can raise your  pressure) Relax in a comfortable upright chair Feet on the ground Left arm resting comfortably on a flat surface at the level of your heart Legs uncrossed Back supported Sit quietly and don't talk Place the cuff on your bare arm Adjust snuggly, so that only two fingertips can fit between your skin and the top of the cuff Check 2 readings separated by at least one minute Keep a log of your BP readings For a visual, please reference this diagram: http://ccnc.care/bpdiagram  Provider Name: [facility]     Phone: [phone]  Zone 1: ALL CLEAR  Continue to monitor your symptoms:  BP reading is less than 140 (top number) or less than 90 (bottom number)  No right upper stomach pain No headaches or seeing spots No feeling nauseated or throwing up No swelling in face and hands  Zone 2: CAUTION Call your doctor's office for any of the following:  BP reading is greater than 140 (top number) or greater than 90 (bottom number)  Stomach pain under your ribs in the middle or right side Headaches or seeing spots Feeling nauseated or throwing up Swelling in face and hands  Zone 3: EMERGENCY  Seek immediate medical care if you have any of the following:  BP reading is greater than160 (top number) or greater than 110 (bottom number) Severe headaches not improving with Tylenol Serious difficulty catching your breath Any worsening symptoms from Zone 2   Third Trimester of Pregnancy The third trimester is from week 29 through week 42, months 7 through 9. The third trimester is a time when the fetus is growing rapidly. At the end of the ninth month, the fetus is about 20 inches in length and weighs 6-10 pounds.  BODY CHANGES Your body goes through many changes during pregnancy. The changes vary from woman to woman.  Your weight will continue to increase. You can expect to gain 25-35 pounds (11-16 kg) by the end of the pregnancy. You may begin to get stretch marks on your hips, abdomen,  and breasts. You may urinate more often because the fetus is moving lower into your pelvis and pressing on your bladder. You may develop or continue to have heartburn as a result of your pregnancy. You may develop constipation because certain hormones are causing the muscles that push waste through your intestines to slow down. You may develop hemorrhoids or swollen, bulging veins (varicose veins). You may have pelvic pain because of the weight gain and pregnancy hormones relaxing your joints between the bones in your pelvis. Backaches may result from overexertion of the muscles supporting your posture. You may have changes in your hair. These can include thickening of your hair, rapid growth, and changes in texture. Some women also have hair loss during or after pregnancy, or hair that feels dry or thin. Your hair will most likely return to normal after your baby is born. Your breasts will continue to grow and be tender. A yellow discharge may leak from your breasts called colostrum. Your belly button may stick out. You may   feel short of breath because of your expanding uterus. You may notice the fetus "dropping," or moving lower in your abdomen. You may have a bloody mucus discharge. This usually occurs a few days to a week before labor begins. Your cervix becomes thin and soft (effaced) near your due date. WHAT TO EXPECT AT YOUR PRENATAL EXAMS  You will have prenatal exams every 2 weeks until week 36. Then, you will have weekly prenatal exams. During a routine prenatal visit: You will be weighed to make sure you and the fetus are growing normally. Your blood pressure is taken. Your abdomen will be measured to track your baby's growth. The fetal heartbeat will be listened to. Any test results from the previous visit will be discussed. You may have a cervical check near your due date to see if you have effaced. At around 36 weeks, your caregiver will check your cervix. At the same time, your  caregiver will also perform a test on the secretions of the vaginal tissue. This test is to determine if a type of bacteria, Group B streptococcus, is present. Your caregiver will explain this further. Your caregiver may ask you: What your birth plan is. How you are feeling. If you are feeling the baby move. If you have had any abnormal symptoms, such as leaking fluid, bleeding, severe headaches, or abdominal cramping. If you have any questions. Other tests or screenings that may be performed during your third trimester include: Blood tests that check for low iron levels (anemia). Fetal testing to check the health, activity level, and growth of the fetus. Testing is done if you have certain medical conditions or if there are problems during the pregnancy. FALSE LABOR You may feel small, irregular contractions that eventually go away. These are called Braxton Hicks contractions, or false labor. Contractions may last for hours, days, or even weeks before true labor sets in. If contractions come at regular intervals, intensify, or become painful, it is best to be seen by your caregiver.  SIGNS OF LABOR  Menstrual-like cramps. Contractions that are 5 minutes apart or less. Contractions that start on the top of the uterus and spread down to the lower abdomen and back. A sense of increased pelvic pressure or back pain. A watery or bloody mucus discharge that comes from the vagina. If you have any of these signs before the 37th week of pregnancy, call your caregiver right away. You need to go to the hospital to get checked immediately. HOME CARE INSTRUCTIONS  Avoid all smoking, herbs, alcohol, and unprescribed drugs. These chemicals affect the formation and growth of the baby. Follow your caregiver's instructions regarding medicine use. There are medicines that are either safe or unsafe to take during pregnancy. Exercise only as directed by your caregiver. Experiencing uterine cramps is a good sign to  stop exercising. Continue to eat regular, healthy meals. Wear a good support bra for breast tenderness. Do not use hot tubs, steam rooms, or saunas. Wear your seat belt at all times when driving. Avoid raw meat, uncooked cheese, cat litter boxes, and soil used by cats. These carry germs that can cause birth defects in the baby. Take your prenatal vitamins. Try taking a stool softener (if your caregiver approves) if you develop constipation. Eat more high-fiber foods, such as fresh vegetables or fruit and whole grains. Drink plenty of fluids to keep your urine clear or pale yellow. Take warm sitz baths to soothe any pain or discomfort caused by hemorrhoids. Use hemorrhoid cream if   your caregiver approves. If you develop varicose veins, wear support hose. Elevate your feet for 15 minutes, 3-4 times a day. Limit salt in your diet. Avoid heavy lifting, wear low heal shoes, and practice good posture. Rest a lot with your legs elevated if you have leg cramps or low back pain. Visit your dentist if you have not gone during your pregnancy. Use a soft toothbrush to brush your teeth and be gentle when you floss. A sexual relationship may be continued unless your caregiver directs you otherwise. Do not travel far distances unless it is absolutely necessary and only with the approval of your caregiver. Take prenatal classes to understand, practice, and ask questions about the labor and delivery. Make a trial run to the hospital. Pack your hospital bag. Prepare the baby's nursery. Continue to go to all your prenatal visits as directed by your caregiver. SEEK MEDICAL CARE IF: You are unsure if you are in labor or if your water has broken. You have dizziness. You have mild pelvic cramps, pelvic pressure, or nagging pain in your abdominal area. You have persistent nausea, vomiting, or diarrhea. You have a bad smelling vaginal discharge. You have pain with urination. SEEK IMMEDIATE MEDICAL CARE IF:  You  have a fever. You are leaking fluid from your vagina. You have spotting or bleeding from your vagina. You have severe abdominal cramping or pain. You have rapid weight loss or gain. You have shortness of breath with chest pain. You notice sudden or extreme swelling of your face, hands, ankles, feet, or legs. You have not felt your baby move in over an hour. You have severe headaches that do not go away with medicine. You have vision changes. Document Released: 10/20/2001 Document Revised: 10/31/2013 Document Reviewed: 12/27/2012 ExitCare Patient Information 2015 [employer]. This information is not intended to replace advice given to you by your health care provider. Make sure you discuss any questions you have with your health care provider.       

## 2021-08-12 ENCOUNTER — Other Ambulatory Visit: Payer: Self-pay | Admitting: Women's Health

## 2021-08-12 LAB — HIV ANTIBODY (ROUTINE TESTING W REFLEX): HIV Screen 4th Generation wRfx: NONREACTIVE

## 2021-08-12 LAB — CBC
Hematocrit: 30.9 % — ABNORMAL LOW (ref 34.0–46.6)
Hemoglobin: 10.5 g/dL — ABNORMAL LOW (ref 11.1–15.9)
MCH: 28.1 pg (ref 26.6–33.0)
MCHC: 34 g/dL (ref 31.5–35.7)
MCV: 83 fL (ref 79–97)
Platelets: 307 10*3/uL (ref 150–450)
RBC: 3.74 x10E6/uL — ABNORMAL LOW (ref 3.77–5.28)
RDW: 13 % (ref 11.7–15.4)
WBC: 12.9 10*3/uL — ABNORMAL HIGH (ref 3.4–10.8)

## 2021-08-12 LAB — RPR: RPR Ser Ql: NONREACTIVE

## 2021-08-12 LAB — GLUCOSE TOLERANCE, 2 HOURS W/ 1HR
Glucose, 1 hour: 119 mg/dL (ref 65–179)
Glucose, 2 hour: 71 mg/dL (ref 65–152)
Glucose, Fasting: 71 mg/dL (ref 65–91)

## 2021-08-12 LAB — ANTIBODY SCREEN: Antibody Screen: NEGATIVE

## 2021-08-12 MED ORDER — FERROUS SULFATE 325 (65 FE) MG PO TABS: 325.0000 mg | ORAL_TABLET | ORAL | 2 refills | Status: DC

## 2021-08-18 ENCOUNTER — Other Ambulatory Visit: Payer: Self-pay

## 2021-08-18 ENCOUNTER — Ambulatory Visit (INDEPENDENT_AMBULATORY_CARE_PROVIDER_SITE_OTHER): Payer: Medicaid Other | Admitting: *Deleted

## 2021-08-18 VITALS — BP 110/70 | HR 92 | Wt 153.2 lb

## 2021-08-18 DIAGNOSIS — O10012 Pre-existing essential hypertension complicating pregnancy, second trimester: Secondary | ICD-10-CM

## 2021-08-18 DIAGNOSIS — Z1389 Encounter for screening for other disorder: Secondary | ICD-10-CM

## 2021-08-18 DIAGNOSIS — O0993 Supervision of high risk pregnancy, unspecified, third trimester: Secondary | ICD-10-CM | POA: Diagnosis not present

## 2021-08-18 DIAGNOSIS — Z3A29 29 weeks gestation of pregnancy: Secondary | ICD-10-CM

## 2021-08-18 DIAGNOSIS — O288 Other abnormal findings on antenatal screening of mother: Secondary | ICD-10-CM

## 2021-08-18 DIAGNOSIS — Z331 Pregnant state, incidental: Secondary | ICD-10-CM

## 2021-08-18 LAB — POCT URINALYSIS DIPSTICK OB
Bilirubin, UA: NEGATIVE
Blood, UA: NEGATIVE
Clarity, UA: NEGATIVE
Ketones, UA: NEGATIVE
Leukocytes, UA: NEGATIVE
Nitrite, UA: NEGATIVE

## 2021-08-18 NOTE — Progress Notes (Addendum)
   NURSE VISIT- NST  SUBJECTIVE:  Brooke Briggs is a 30 y.o. G63P2002 female at [redacted]w[redacted]d, here for a NST for pregnancy complicated by [facility] and Decreased fetal movement.  She reports decreased  fetal movement, contractions: none, vaginal bleeding: none, membranes: intact.   OBJECTIVE:  BP 110/70   Pulse 92   Wt 153 lb 3.2 oz (69.5 kg)   BMI 26.30 kg/m   Appears well, no apparent distress  Results for orders placed or performed in visit on 08/18/21 (from the past 24 hour(s))  POC Urinalysis Dipstick OB   Collection Time: 08/18/21  4:20 PM  Result Value Ref Range   Color, UA     Clarity, UA neg    Glucose, UA     Bilirubin, UA neg    Ketones, UA neg    Spec Grav, UA     Blood, UA neg    pH, UA     POC,PROTEIN,UA Trace Negative, Trace, Small (1+), Moderate (2+), Large (3+), 4+   Urobilinogen, UA     Nitrite, UA neg    Leukocytes, UA Negative Negative   Appearance     Odor      NST: FHR baseline 150 bpm, Variability: moderate, Accelerations:present, Decelerations:  Absent= Cat 1/reactive Toco: none   ASSESSMENT: G3P2002 at [redacted]w[redacted]d with CHTN and Decreased fetal movement NST reactive  PLAN: EFM strip reviewed by Joellyn Haff, CNM, [facility]   Recommendations: keep next appointment as scheduled    [name]  08/18/2021 5:05 PM  Chart reviewed for nurse visit. Agree with plan of care.  Cheral Marker, [place] 08/19/2021 10:52 AM

## 2021-09-08 ENCOUNTER — Other Ambulatory Visit: Payer: Self-pay

## 2021-09-08 ENCOUNTER — Ambulatory Visit (INDEPENDENT_AMBULATORY_CARE_PROVIDER_SITE_OTHER): Payer: Medicaid Other | Admitting: *Deleted

## 2021-09-08 VITALS — BP 114/72 | HR 92 | Wt 161.0 lb

## 2021-09-08 DIAGNOSIS — O10012 Pre-existing essential hypertension complicating pregnancy, second trimester: Secondary | ICD-10-CM

## 2021-09-08 DIAGNOSIS — Z1389 Encounter for screening for other disorder: Secondary | ICD-10-CM

## 2021-09-08 DIAGNOSIS — Z331 Pregnant state, incidental: Secondary | ICD-10-CM

## 2021-09-08 DIAGNOSIS — O0993 Supervision of high risk pregnancy, unspecified, third trimester: Secondary | ICD-10-CM

## 2021-09-08 DIAGNOSIS — O288 Other abnormal findings on antenatal screening of mother: Secondary | ICD-10-CM

## 2021-09-08 LAB — POCT URINALYSIS DIPSTICK OB
Blood, UA: NEGATIVE
Glucose, UA: NEGATIVE
Ketones, UA: NEGATIVE
Leukocytes, UA: NEGATIVE
Nitrite, UA: NEGATIVE

## 2021-09-08 NOTE — Progress Notes (Addendum)
   NURSE VISIT- NST  SUBJECTIVE:  Brooke Briggs is a 30 y.o. G77P2002 female at [redacted]w[redacted]d, here for a NST for pregnancy complicated by [facility].  She reports active fetal movement, contractions: none, vaginal bleeding: none, membranes: intact.   OBJECTIVE:  BP 114/72   Pulse 92   Wt 161 lb (73 kg)   BMI 27.64 kg/m   Appears well, no apparent distress  Results for orders placed or performed in visit on 09/08/21 (from the past 24 hour(s))  POC Urinalysis Dipstick OB   Collection Time: 09/08/21  9:42 AM  Result Value Ref Range   Color, UA     Clarity, UA     Glucose, UA Negative Negative   Bilirubin, UA     Ketones, UA neg    Spec Grav, UA     Blood, UA neg    pH, UA     POC,PROTEIN,UA Trace Negative, Trace, Small (1+), Moderate (2+), Large (3+), 4+   Urobilinogen, UA     Nitrite, UA neg    Leukocytes, UA Negative Negative   Appearance     Odor      NST: FHR baseline 145 bpm, Variability: moderate, Accelerations:present, Decelerations:  Absent= Cat 1/reactive Toco: none   ASSESSMENT: G3P2002 at [redacted]w[redacted]d with CHTN NST reactive  PLAN: EFM strip reviewed by Dr. Despina Hidden   Recommendations: keep next appointment as scheduled    [name]  09/08/2021 11:39 AM   Attestation of Attending Supervision of Nursing Visit Encounter: Evaluation and management procedures were performed by the nursing staff under my supervision and collaboration.  I have reviewed the nurse's note and chart, and I agree with the management and plan.  Rockne Coons MD Attending Physician for the Center for [facility] 09/19/2021 10:13 AM

## 2021-09-11 ENCOUNTER — Other Ambulatory Visit: Payer: Self-pay

## 2021-09-11 ENCOUNTER — Ambulatory Visit (INDEPENDENT_AMBULATORY_CARE_PROVIDER_SITE_OTHER): Payer: Medicaid Other | Admitting: Advanced Practice Midwife

## 2021-09-11 ENCOUNTER — Ambulatory Visit (INDEPENDENT_AMBULATORY_CARE_PROVIDER_SITE_OTHER): Payer: Medicaid Other

## 2021-09-11 VITALS — BP 121/80 | HR 91 | Wt 161.0 lb

## 2021-09-11 DIAGNOSIS — Z23 Encounter for immunization: Secondary | ICD-10-CM

## 2021-09-11 DIAGNOSIS — Z3A33 33 weeks gestation of pregnancy: Secondary | ICD-10-CM | POA: Diagnosis not present

## 2021-09-11 DIAGNOSIS — O10919 Unspecified pre-existing hypertension complicating pregnancy, unspecified trimester: Secondary | ICD-10-CM

## 2021-09-11 DIAGNOSIS — O0993 Supervision of high risk pregnancy, unspecified, third trimester: Secondary | ICD-10-CM

## 2021-09-11 DIAGNOSIS — O09893 Supervision of other high risk pregnancies, third trimester: Secondary | ICD-10-CM | POA: Diagnosis not present

## 2021-09-11 LAB — POCT URINALYSIS DIPSTICK OB
Blood, UA: NEGATIVE
Glucose, UA: NEGATIVE
Ketones, UA: NEGATIVE
Nitrite, UA: NEGATIVE
POC,PROTEIN,UA: NEGATIVE

## 2021-09-11 NOTE — Progress Notes (Signed)
[place] 33+1 wks,cephalic,anterior placenta gr 2,FHR 152 bpm,AFI 14.7 cm,BPP 8/8,normal ovaries,RI .63,.67,.61,.68=65%,EFW 2181 g 48%

## 2021-09-11 NOTE — Patient Instructions (Addendum)
Brooke Briggs, I greatly value your feedback.  If you receive a survey following your visit with [place] today, we appreciate you taking the time to fill it out.  Thanks, Cathie Beams, CNM   [facility] HAS MOVED!!! It is now [facility] ([address] [place], [place] [address]) Entrance located off of [address] Free 24/7 valet parking   Go to [email or web address] to register for FREE online childbirth classes    Call the office [phone]) or go to [facility] if: You begin to have strong, frequent contractions Your water breaks.  Sometimes it is a big gush of fluid, sometimes it is just a trickle that keeps getting your panties wet or running down your legs You have vaginal bleeding.  It is normal to have a small amount of spotting if your cervix was checked.  You don't feel your baby moving like normal.  If you don't, get you something to eat and drink and lay down and focus on feeling your baby move.  You should feel at least 10 movements in 2 hours.  If you don't, you should call the office or go to [facility].    Tdap Vaccine It is recommended that you get the Tdap vaccine during the third trimester of EACH pregnancy to help protect your baby from getting pertussis (whooping cough) 27-36 weeks is the BEST time to do this so that you can pass the protection on to your baby. During pregnancy is better than after pregnancy, but if you are unable to get it during pregnancy it will be offered at the hospital.  You will be offered this vaccine in the office after 27 weeks. If you do not have health insurance, you can get this vaccine at the health department or your family doctor Everyone who will be around your baby should also be up-to-date on their vaccines. Adults (who are not pregnant) only need 1 dose of Tdap during adulthood.   Third Trimester of Pregnancy The third trimester is from week 29 through week 42, months 7 through 9. The third trimester  is a time when the fetus is growing rapidly. At the end of the ninth month, the fetus is about 20 inches in length and weighs 6-10 pounds.  BODY CHANGES Your body goes through many changes during pregnancy. The changes vary from woman to woman.  Your weight will continue to increase. You can expect to gain 25-35 pounds (11-16 kg) by the end of the pregnancy. You may begin to get stretch marks on your hips, abdomen, and breasts. You may urinate more often because the fetus is moving lower into your pelvis and pressing on your bladder. You may develop or continue to have heartburn as a result of your pregnancy. You may develop constipation because certain hormones are causing the muscles that push waste through your intestines to slow down. You may develop hemorrhoids or swollen, bulging veins (varicose veins). You may have pelvic pain because of the weight gain and pregnancy hormones relaxing your joints between the bones in your pelvis. Backaches may result from overexertion of the muscles supporting your posture. You may have changes in your hair. These can include thickening of your hair, rapid growth, and changes in texture. Some women also have hair loss during or after pregnancy, or hair that feels dry or thin. Your hair will most likely return to normal after your baby is born. Your breasts will continue to grow and be tender. A yellow  discharge may leak from your breasts called colostrum. Your belly button may stick out. You may feel short of breath because of your expanding uterus. You may notice the fetus "dropping," or moving lower in your abdomen. You may have a bloody mucus discharge. This usually occurs a few days to a week before labor begins. Your cervix becomes thin and soft (effaced) near your due date. WHAT TO EXPECT AT YOUR PRENATAL EXAMS  You will have prenatal exams every 2 weeks until week 36. Then, you will have weekly prenatal exams. During a routine prenatal visit: You  will be weighed to make sure you and the fetus are growing normally. Your blood pressure is taken. Your abdomen will be measured to track your baby's growth. The fetal heartbeat will be listened to. Any test results from the previous visit will be discussed. You may have a cervical check near your due date to see if you have effaced. At around 36 weeks, your caregiver will check your cervix. At the same time, your caregiver will also perform a test on the secretions of the vaginal tissue. This test is to determine if a type of bacteria, Group B streptococcus, is present. Your caregiver will explain this further. Your caregiver may ask you: What your birth plan is. How you are feeling. If you are feeling the baby move. If you have had any abnormal symptoms, such as leaking fluid, bleeding, severe headaches, or abdominal cramping. If you have any questions. Other tests or screenings that may be performed during your third trimester include: Blood tests that check for low iron levels (anemia). Fetal testing to check the health, activity level, and growth of the fetus. Testing is done if you have certain medical conditions or if there are problems during the pregnancy. FALSE LABOR You may feel small, irregular contractions that eventually go away. These are called Braxton Hicks contractions, or false labor. Contractions may last for hours, days, or even weeks before true labor sets in. If contractions come at regular intervals, intensify, or become painful, it is best to be seen by your caregiver.  SIGNS OF LABOR  Menstrual-like cramps. Contractions that are 5 minutes apart or less. Contractions that start on the top of the uterus and spread down to the lower abdomen and back. A sense of increased pelvic pressure or back pain. A watery or bloody mucus discharge that comes from the vagina. If you have any of these signs before the 37th week of pregnancy, call your caregiver right away. You need to  go to the hospital to get checked immediately. HOME CARE INSTRUCTIONS  Avoid all smoking, herbs, alcohol, and unprescribed drugs. These chemicals affect the formation and growth of the baby. Follow your caregiver's instructions regarding medicine use. There are medicines that are either safe or unsafe to take during pregnancy. Exercise only as directed by your caregiver. Experiencing uterine cramps is a good sign to stop exercising. Continue to eat regular, healthy meals. Wear a good support bra for breast tenderness. Do not use hot tubs, steam rooms, or saunas. Wear your seat belt at all times when driving. Avoid raw meat, uncooked cheese, cat litter boxes, and soil used by cats. These carry germs that can cause birth defects in the baby. Take your prenatal vitamins. Try taking a stool softener (if your caregiver approves) if you develop constipation. Eat more high-fiber foods, such as fresh vegetables or fruit and whole grains. Drink plenty of fluids to keep your urine clear or pale yellow. Take  warm sitz baths to soothe any pain or discomfort caused by hemorrhoids. Use hemorrhoid cream if your caregiver approves. If you develop varicose veins, wear support hose. Elevate your feet for 15 minutes, 3-4 times a day. Limit salt in your diet. Avoid heavy lifting, wear low heal shoes, and practice good posture. Rest a lot with your legs elevated if you have leg cramps or low back pain. Visit your dentist if you have not gone during your pregnancy. Use a soft toothbrush to brush your teeth and be gentle when you floss. A sexual relationship may be continued unless your caregiver directs you otherwise. Do not travel far distances unless it is absolutely necessary and only with the approval of your caregiver. Take prenatal classes to understand, practice, and ask questions about the labor and delivery. Make a trial run to the hospital. Pack your hospital bag. Prepare the baby's nursery. Continue to  go to all your prenatal visits as directed by your caregiver. SEEK MEDICAL CARE IF: You are unsure if you are in labor or if your water has broken. You have dizziness. You have mild pelvic cramps, pelvic pressure, or nagging pain in your abdominal area. You have persistent nausea, vomiting, or diarrhea. You have a bad smelling vaginal discharge. You have pain with urination. SEEK IMMEDIATE MEDICAL CARE IF:  You have a fever. You are leaking fluid from your vagina. You have spotting or bleeding from your vagina. You have severe abdominal cramping or pain. You have rapid weight loss or gain. You have shortness of breath with chest pain. You notice sudden or extreme swelling of your face, hands, ankles, feet, or legs. You have not felt your baby move in over an hour. You have severe headaches that do not go away with medicine. You have vision changes. Document Released: 10/20/2001 Document Revised: 10/31/2013 Document Reviewed: 12/27/2012 [facility] Patient Information 2015 [place], [place]. This information is not intended to replace advice given to you by your health care provider. Make sure you discuss any questions you have with your health care provider.

## 2021-09-11 NOTE — Progress Notes (Signed)
HIGH-RISK PREGNANCY VISIT Patient name: Brooke Briggs MRN [id number]  Date of birth: [birth year] Chief Complaint:   Routine Prenatal Visit  History of Present Illness:   Brooke Briggs is a 30 y.o. G92P2002 female at [redacted]w[redacted]d with an Estimated Date of Delivery: 10/29/21 being seen today for ongoing management of a high-risk pregnancy complicated by [facility] currently on labetalol 200mg  BID Today she reports LBP, numbness in LE. Contractions: Not present. Vag. Bleeding: None.  Movement: Present. denies leaking of fluid.  Review of Systems:   Pertinent items are noted in HPI Denies abnormal vaginal discharge w/ itching/odor/irritation, headaches, visual changes, shortness of breath, chest pain, abdominal pain, severe nausea/vomiting, or problems with urination or bowel movements unless otherwise stated above. Pertinent History Reviewed:  Reviewed past medical,surgical, social, obstetrical and family history.  Reviewed problem list, medications and allergies. Physical Assessment:   Vitals:   09/11/21 1020  BP: 121/80  Pulse: 91  Weight: 161 lb (73 kg)  Body mass index is 27.64 kg/m.           Physical Examination:   General appearance: alert, well appearing, and in no distress  Mental status: alert, oriented to person, place, and time  Skin: warm & dry   Extremities: Edema: Trace    Cardiovascular: normal heart rate noted  Respiratory: normal respiratory effort, no distress  Abdomen: gravid, soft, non-tender  Pelvic: Cervical exam deferred         Fetal Status:     Movement: Present    Fetal Surveillance Testing today: 13/03/22 33+1 wks,cephalic,anterior placenta gr 2,FHR 152 bpm,AFI 14.7 cm,BPP 8/8,normal ovaries,RI .63,.67,.61,.68=65%,EFW 2181 g 48%   Results for orders placed or performed in visit on 09/11/21 (from the past 24 hour(s))  POC Urinalysis Dipstick OB   Collection Time: 09/11/21  9:53 AM  Result Value Ref Range   Color, UA     Clarity, UA     Glucose, UA Negative Negative    Bilirubin, UA     Ketones, UA neg    Spec Grav, UA     Blood, UA neg    pH, UA     POC,PROTEIN,UA Negative Negative, Trace, Small (1+), Moderate (2+), Large (3+), 4+   Urobilinogen, UA     Nitrite, UA neg    Leukocytes, UA Large (3+) (A) Negative   Appearance     Odor      Assessment & Plan:  1) High-risk pregnancy G3P2002 at [redacted]w[redacted]d with an Estimated Date of Delivery: 10/29/21   2) CHTN, stable Treatment plan  Meds: Labetalol 200mg  BID       Growth u/s q 4wks    2x/wk testing nst/sono @ 32wks     Deliver 38-39wks (37wks or prn if poor control)____  3)  LBP, tips given  Mat belt/compression socks  Meds: No orders of the defined types were placed in this encounter.   Labs/procedures today: BPP/dopplers   Reviewed: Preterm labor symptoms and general obstetric precautions including but not limited to vaginal bleeding, contractions, leaking of fluid and fetal movement were reviewed in detail with the patient.  All questions were answered. Has home bp cuff. . Check bp weekly, let 10/31/21 know if >140/90.   Follow-up: Return for As scheduled.  Future Appointments  Date Time Provider Department Center  09/15/2021 10:30 AM [facility] NURSE [facility]  09/18/2021  9:00 AM [facility] 13/05/2021 [facility] None  09/18/2021  9:50 AM [place], CNM [facility]  09/22/2021 10:30 AM [facility] NURSE [facility]  09/25/2021  9:00 AM [facility] [place] [facility] None  09/25/2021  9:50 AM Christin Fudge, CNM [facility]  09/29/2021 10:30 AM [facility] NURSE [facility]  10/01/2021  9:00 AM [facility] [place] [facility] None  10/01/2021  9:50 AM Myrtis Ser, CNM [facility]  10/06/2021 10:10 AM [facility] NURSE [facility]  10/09/2021  9:00 AM [facility] [place] [facility] None  10/09/2021  9:50 AM Janyth Pupa, DO [facility]  10/13/2021 10:30 AM [facility] NURSE [facility]  10/16/2021  9:00 AM [facility] [place] [facility] None  10/16/2021  9:50 AM Roma Schanz, CNM [facility]  10/20/2021 10:30 AM [facility] NURSE [facility]    Orders Placed This Encounter  Procedures   Urine Culture   Tdap vaccine greater than or equal to 7yo IM   Flu Vaccine QUAD 56mo+IM (Fluarix, Fluzone & Alfiuria Quad PF)   POC Urinalysis Dipstick OB   Christin Fudge DNP, CNM 09/11/2021 11:30 AM

## 2021-09-15 ENCOUNTER — Other Ambulatory Visit: Payer: Self-pay

## 2021-09-15 ENCOUNTER — Ambulatory Visit (INDEPENDENT_AMBULATORY_CARE_PROVIDER_SITE_OTHER): Payer: Medicaid Other | Admitting: *Deleted

## 2021-09-15 VITALS — BP 124/74 | HR 101 | Wt 165.0 lb

## 2021-09-15 DIAGNOSIS — O0993 Supervision of high risk pregnancy, unspecified, third trimester: Secondary | ICD-10-CM

## 2021-09-15 DIAGNOSIS — Z3A33 33 weeks gestation of pregnancy: Secondary | ICD-10-CM

## 2021-09-15 DIAGNOSIS — O10012 Pre-existing essential hypertension complicating pregnancy, second trimester: Secondary | ICD-10-CM

## 2021-09-15 DIAGNOSIS — Z331 Pregnant state, incidental: Secondary | ICD-10-CM

## 2021-09-15 DIAGNOSIS — Z1389 Encounter for screening for other disorder: Secondary | ICD-10-CM

## 2021-09-15 DIAGNOSIS — O288 Other abnormal findings on antenatal screening of mother: Secondary | ICD-10-CM

## 2021-09-15 LAB — POCT URINALYSIS DIPSTICK OB
Blood, UA: NEGATIVE
Glucose, UA: NEGATIVE
Ketones, UA: NEGATIVE
Leukocytes, UA: NEGATIVE
Nitrite, UA: NEGATIVE

## 2021-09-15 NOTE — Progress Notes (Addendum)
   NURSE VISIT- NST  SUBJECTIVE:  Brooke Briggs is a 30 y.o. G20P2002 female at [redacted]w[redacted]d, here for a NST for pregnancy complicated by [facility].  She reports active fetal movement, contractions: none, vaginal bleeding: none, membranes: intact.   OBJECTIVE:  BP 124/74   Pulse (!) 101   Wt 165 lb (74.8 kg)   BMI 28.32 kg/m   Appears well, no apparent distress  Results for orders placed or performed in visit on 09/15/21 (from the past 24 hour(s))  POC Urinalysis Dipstick OB   Collection Time: 09/15/21 10:42 AM  Result Value Ref Range   Color, UA     Clarity, UA     Glucose, UA Negative Negative   Bilirubin, UA     Ketones, UA neg    Spec Grav, UA     Blood, UA neg    pH, UA     POC,PROTEIN,UA Trace Negative, Trace, Small (1+), Moderate (2+), Large (3+), 4+   Urobilinogen, UA     Nitrite, UA neg    Leukocytes, UA Negative Negative   Appearance     Odor      NST: FHR baseline 145 bpm, Variability: moderate, Accelerations:present, Decelerations:  Absent= Cat 1/reactive Toco: none   ASSESSMENT: G3P2002 at [redacted]w[redacted]d with CHTN NST reactive  PLAN: EFM strip reviewed by Dr. Despina Hidden   Recommendations: keep next appointment as scheduled    [name]  09/15/2021 1:51 PM  Attestation of Attending Supervision of Nursing Visit Encounter: Evaluation and management procedures were performed by the nursing staff under my supervision and collaboration.  I have reviewed the nurse's note and chart, and I agree with the management and plan.  Rockne Coons MD Attending Physician for the [facility] 09/19/2021 10:02 AM

## 2021-09-18 ENCOUNTER — Ambulatory Visit (INDEPENDENT_AMBULATORY_CARE_PROVIDER_SITE_OTHER): Payer: Medicaid Other

## 2021-09-18 ENCOUNTER — Ambulatory Visit (INDEPENDENT_AMBULATORY_CARE_PROVIDER_SITE_OTHER): Payer: Medicaid Other | Admitting: Women's Health

## 2021-09-18 ENCOUNTER — Other Ambulatory Visit: Payer: Self-pay

## 2021-09-18 ENCOUNTER — Encounter: Payer: Self-pay | Admitting: Women's Health

## 2021-09-18 VITALS — BP 108/71 | HR 102 | Wt 162.0 lb

## 2021-09-18 DIAGNOSIS — O0993 Supervision of high risk pregnancy, unspecified, third trimester: Secondary | ICD-10-CM

## 2021-09-18 DIAGNOSIS — O10919 Unspecified pre-existing hypertension complicating pregnancy, unspecified trimester: Secondary | ICD-10-CM | POA: Diagnosis not present

## 2021-09-18 DIAGNOSIS — O10012 Pre-existing essential hypertension complicating pregnancy, second trimester: Secondary | ICD-10-CM

## 2021-09-18 DIAGNOSIS — Z3A34 34 weeks gestation of pregnancy: Secondary | ICD-10-CM | POA: Diagnosis not present

## 2021-09-18 LAB — POCT URINALYSIS DIPSTICK OB
Blood, UA: NEGATIVE
Glucose, UA: NEGATIVE
Ketones, UA: NEGATIVE
Nitrite, UA: POSITIVE

## 2021-09-18 LAB — URINE CULTURE

## 2021-09-18 MED ORDER — SULFAMETHOXAZOLE-TRIMETHOPRIM 800-160 MG PO TABS: 1.0000 | ORAL_TABLET | Freq: Two times a day (BID) | ORAL | 0 refills | Status: DC

## 2021-09-18 NOTE — Progress Notes (Signed)
[place] 34+1 wks,cephalic,BPP 8/8,anterior placenta gr 2,normal ovaries,FHR 137 bpm,RI .59,.61,.55=46%

## 2021-09-18 NOTE — Patient Instructions (Signed)
Brooke Briggs, thank you for choosing our office today! We appreciate the opportunity to meet your healthcare needs. You may receive a short survey by mail, e-mail, or through [employer]. If you are happy with your care we would appreciate if you could take just a few minutes to complete the survey questions. We read all of your comments and take your feedback very seriously. Thank you again for choosing our office.  Center for [name] Team at [facility]  [facility] at [facility] ([address] [place], [place] [address]) Entrance C, located off of [address] Free 24/7 valet parking   CLASSES: Go to [email or web address] to register for classes (childbirth, breastfeeding, waterbirth, infant CPR, daddy bootcamp, etc.)  Call the office [phone]) or go to [facility] if: You begin to have strong, frequent contractions Your water breaks.  Sometimes it is a big gush of fluid, sometimes it is just a trickle that keeps getting your panties wet or running down your legs You have vaginal bleeding.  It is normal to have a small amount of spotting if your cervix was checked.  You don't feel your baby moving like normal.  If you don't, get you something to eat and drink and lay down and focus on feeling your baby move.   If your baby is still not moving like normal, you should call the office or go to [facility]  Call the office [phone]) or go to [facility] hospital for these signs of pre-eclampsia: Severe headache that does not go away with Tylenol Visual changes- seeing spots, double, blurred vision Pain under your right breast or upper abdomen that does not go away with Tums or heartburn medicine Nausea and/or vomiting Severe swelling in your hands, feet, and face   Tdap Vaccine It is recommended that you get the Tdap vaccine during the third trimester of EACH pregnancy to help protect your baby from getting pertussis (whooping cough) 27-36 weeks is the BEST time to do  this so that you can pass the protection on to your baby. During pregnancy is better than after pregnancy, but if you are unable to get it during pregnancy it will be offered at the hospital.  You can get this vaccine with [place], at the health department, your family doctor, or some local pharmacies Everyone who will be around your baby should also be up-to-date on their vaccines before the baby comes. Adults (who are not pregnant) only need 1 dose of Tdap during adulthood.   [facility] Pediatricians/Family Doctors [facility]): [address] Colette Ribas, [phone]           [facility]: [address], [phone]                [facility]): [address], [phone] (call to ask if accepting patients) [facility] Department: [address], [place], [phone]    [facility] Pediatricians/Family Doctors [facility]): [phone] [address], [phone] [facility]: [address] [place], [phone] [facility]: [address], [phone]  [facility] Doctors  [facility]): [phone] [facility]: [address] [phone]   [facility] Doctors [facility]: [address], [phone]  [facility] Family Doctors  [facility]: [phone], [phone]  Home Blood Pressure Monitoring for Patients   Your provider has recommended that you check your  blood pressure (BP) at least once a week at home. If you do not have a blood pressure cuff at home, one will be provided for you. Contact your provider if you have not received your monitor within 1 week.   Helpful Tips for Accurate Home Blood Pressure Checks  Don't smoke, exercise, or drink caffeine 30 minutes before checking your BP Use the restroom before checking your BP (a full bladder can raise your  pressure) Relax in a comfortable upright chair Feet on the ground Left arm resting comfortably on a flat surface at the level of your heart Legs uncrossed Back supported Sit quietly and don't talk Place the cuff on your bare arm Adjust snuggly, so that only two fingertips can fit between your skin and the top of the cuff Check 2 readings separated by at least one minute Keep a log of your BP readings For a visual, please reference this diagram: http://ccnc.care/bpdiagram  Provider Name: [facility]     Phone: [phone]  Zone 1: ALL CLEAR  Continue to monitor your symptoms:  BP reading is less than 140 (top number) or less than 90 (bottom number)  No right upper stomach pain No headaches or seeing spots No feeling nauseated or throwing up No swelling in face and hands  Zone 2: CAUTION Call your doctor's office for any of the following:  BP reading is greater than 140 (top number) or greater than 90 (bottom number)  Stomach pain under your ribs in the middle or right side Headaches or seeing spots Feeling nauseated or throwing up Swelling in face and hands  Zone 3: EMERGENCY  Seek immediate medical care if you have any of the following:  BP reading is greater than160 (top number) or greater than 110 (bottom number) Severe headaches not improving with Tylenol Serious difficulty catching your breath Any worsening symptoms from Zone 2  Preterm Labor and Birth Information  The normal length of a pregnancy is 39-41 weeks. Preterm labor is when labor starts before 37 completed weeks of pregnancy. What are the risk factors for preterm labor? Preterm labor is more likely to occur in women who: Have certain infections during pregnancy such as a bladder infection, sexually transmitted infection, or infection inside the uterus (chorioamnionitis). Have a shorter-than-normal cervix. Have gone into preterm labor before. Have had surgery on their cervix. Are younger than age 17  or older than age 35. Are African American. Are pregnant with twins or multiple babies (multiple gestation). Take street drugs or smoke while pregnant. Do not gain enough weight while pregnant. Became pregnant shortly after having been pregnant. What are the symptoms of preterm labor? Symptoms of preterm labor include: Cramps similar to those that can happen during a menstrual period. The cramps may happen with diarrhea. Pain in the abdomen or lower back. Regular uterine contractions that may feel like tightening of the abdomen. A feeling of increased pressure in the pelvis. Increased watery or bloody mucus discharge from the vagina. Water breaking (ruptured amniotic sac). Why is it important to recognize signs of preterm labor? It is important to recognize signs of preterm labor because babies who are born prematurely may not be fully developed. This can put them at an increased risk for: Long-term (chronic) heart and lung problems. Difficulty immediately after birth with regulating body systems, including blood sugar, body temperature, heart rate, and breathing rate. Bleeding in the brain. Cerebral palsy. Learning difficulties. Death. These risks are highest for babies who are born before 34 weeks   of pregnancy. How is preterm labor treated? Treatment depends on the length of your pregnancy, your condition, and the health of your baby. It may involve: Having a stitch (suture) placed in your cervix to prevent your cervix from opening too early (cerclage). Taking or being given medicines, such as: Hormone medicines. These may be given early in pregnancy to help support the pregnancy. Medicine to stop contractions. Medicines to help mature the baby's lungs. These may be prescribed if the risk of delivery is high. Medicines to prevent your baby from developing cerebral palsy. If the labor happens before 34 weeks of pregnancy, you may need to stay in the hospital. What should I do if I  think I am in preterm labor? If you think that you are going into preterm labor, call your health care provider right away. How can I prevent preterm labor in future pregnancies? To increase your chance of having a full-term pregnancy: Do not use any tobacco products, such as cigarettes, chewing tobacco, and e-cigarettes. If you need help quitting, ask your health care provider. Do not use street drugs or medicines that have not been prescribed to you during your pregnancy. Talk with your health care provider before taking any herbal supplements, even if you have been taking them regularly. Make sure you gain a healthy amount of weight during your pregnancy. Watch for infection. If you think that you might have an infection, get it checked right away. Make sure to tell your health care provider if you have gone into preterm labor before. This information is not intended to replace advice given to you by your health care provider. Make sure you discuss any questions you have with your health care provider. Document Revised: 02/17/2019 Document Reviewed: 03/18/2016 Elsevier Patient Education  2020 Elsevier Inc.   

## 2021-09-18 NOTE — Progress Notes (Signed)
HIGH-RISK PREGNANCY VISIT Patient name: Brooke Briggs MRN [id number]  Date of birth: 08-05-1991 Chief Complaint:   Routine Prenatal Visit, High Risk Gestation, and Pregnancy Ultrasound  History of Present Illness:   Brooke Briggs is a 30 y.o. G18P2002 female at [redacted]w[redacted]d with an Estimated Date of Delivery: 10/29/21 being seen today for ongoing management of a high-risk pregnancy complicated by chronic hypertension currently on Labetalol 200mg  BID.    Today she reports  no UTI sx, did finish antibiotics .  Contractions: Irritability. Vag. Bleeding: None.  Movement: Present. denies leaking of fluid.   Depression screen [facility] 2/9 08/11/2021 04/02/2021 01/02/2020  Decreased Interest 0 2 0  Down, Depressed, Hopeless 0 0 0  PHQ - 2 Score 0 2 0  Altered sleeping 0 2 -  Tired, decreased energy 0 0 -  Change in appetite 0 1 -  Feeling bad or failure about yourself  0 0 -  Trouble concentrating 0 0 -  Moving slowly or fidgety/restless 0 0 -  Suicidal thoughts 0 0 -  PHQ-9 Score 0 5 -     GAD 7 : Generalized Anxiety Score 08/11/2021 04/02/2021  Nervous, Anxious, on Edge 0 3  Control/stop worrying 0 2  Worry too much - different things 0 2  Trouble relaxing 0 2  Restless 0 0  Easily annoyed or irritable 0 2  Afraid - awful might happen 0 2  Total GAD 7 Score 0 13     Review of Systems:   Pertinent items are noted in HPI Denies abnormal vaginal discharge w/ itching/odor/irritation, headaches, visual changes, shortness of breath, chest pain, abdominal pain, severe nausea/vomiting, or problems with urination or bowel movements unless otherwise stated above. Pertinent History Reviewed:  Reviewed past medical,surgical, social, obstetrical and family history.  Reviewed problem list, medications and allergies. Physical Assessment:   Vitals:   09/18/21 0948  BP: 108/71  Pulse: (!) 102  Weight: 162 lb (73.5 kg)  Body mass index is 27.81 kg/m.           Physical Examination:   General appearance:  alert, well appearing, and in no distress  Mental status: alert, oriented to person, place, and time  Skin: warm & dry   Extremities: Edema: None    Cardiovascular: normal heart rate noted  Respiratory: normal respiratory effort, no distress  Abdomen: gravid, soft, non-tender  Pelvic: Cervical exam deferred         Fetal Status: Fetal Heart Rate (bpm): 137 u/s   Movement: Present    Fetal Surveillance Testing today: [place] [id number] wks,cephalic,BPP [id number] placenta gr 2,normal ovaries,FHR 137 bpm,RI .59,.61,.55=46%,AFI 16.2 cm    Chaperone: N/A    Results for orders placed or performed in visit on 09/18/21 (from the past 24 hour(s))  POC Urinalysis Dipstick OB   Collection Time: 09/18/21 10:03 AM  Result Value Ref Range   Color, UA     Clarity, UA     Glucose, UA Negative Negative   Bilirubin, UA     Ketones, UA neg    Spec Grav, UA     Blood, UA neg    pH, UA     POC,PROTEIN,UA Trace Negative, Trace, Small (1+), Moderate (2+), Large (3+), 4+   Urobilinogen, UA     Nitrite, UA pos    Leukocytes, UA Small (1+) (A) Negative   Appearance     Odor      Assessment & Plan:  High-risk pregnancy: [id number] at [redacted]w[redacted]d with an Estimated Date of Delivery:  10/29/21   1) CHTN, stable, labetalol 200mg  BID  2) ASB, rx bactrim, urine cx still pending from 11/3  Meds:  Meds ordered this encounter  Medications   sulfamethoxazole-trimethoprim (BACTRIM DS) 800-160 MG tablet    Sig: Take 1 tablet by mouth 2 (two) times daily. X 7 days    Dispense:  14 tablet    Refill:  0    Order Specific Question:   Supervising Provider    Answer:   13/3 [2510]    Labs/procedures today: U/S  Treatment Plan:   Growth u/s q 4wks    2x/wk testing nst/sono @ 32wks     Deliver 38-39wks (37wks or prn if poor control)____   Reviewed: Preterm labor symptoms and general obstetric precautions including but not limited to vaginal bleeding, contractions, leaking of fluid and fetal movement were reviewed  in detail with the patient.  All questions were answered. Does have home bp cuff. Office bp cuff given: not applicable. Check bp weekly, let Lazaro Arms know if consistently >150 and/or >95.  Follow-up: Return for As scheduled; needs bpp/dopp on 11/17.   Future Appointments  Date Time Provider Department Center  09/22/2021 10:30 AM [facility] NURSE [facility]  09/25/2021  9:50 AM 09/27/2021, CNM [facility]  09/29/2021 10:30 AM [facility] NURSE [facility]  10/01/2021  9:00 AM [facility] 10/03/2021 [facility] None  10/01/2021  9:50 AM 10/03/2021, CNM [facility]  10/06/2021 10:10 AM [facility] NURSE [facility]  10/09/2021  9:00 AM [facility] 14/11/2020 [facility] None  10/09/2021  9:50 AM 14/11/2020, DO [facility]  10/13/2021 10:30 AM [facility] NURSE [facility]  10/16/2021  9:00 AM [facility] 14/06/2021 [facility] None  10/16/2021  9:50 AM 14/06/2021, CNM [facility]  10/20/2021 10:30 AM [facility] NURSE [facility]    Orders Placed This Encounter  Procedures   POC Urinalysis Dipstick OB   14/10/2021 CNM, WHNP-BC 09/18/2021 10:29 AM

## 2021-09-22 ENCOUNTER — Other Ambulatory Visit: Payer: Medicaid Other

## 2021-09-22 ENCOUNTER — Telehealth: Payer: Self-pay | Admitting: *Deleted

## 2021-09-22 NOTE — Telephone Encounter (Signed)
[facility] in [place] called regarding the prescription for Bactrim, and not taking it during pregnancy. I spoke with Brooke Briggs, CNM. Pregnant patients shouldn't take Bactrim in early pregnancy or after 36 weeks. Pt is safe to take Bactrim now. JSY

## 2021-09-25 ENCOUNTER — Encounter: Payer: Medicaid Other | Admitting: Advanced Practice Midwife

## 2021-09-25 ENCOUNTER — Other Ambulatory Visit: Payer: Medicaid Other | Admitting: Advanced Practice Midwife

## 2021-09-25 ENCOUNTER — Other Ambulatory Visit: Payer: Medicaid Other

## 2021-09-29 ENCOUNTER — Ambulatory Visit (INDEPENDENT_AMBULATORY_CARE_PROVIDER_SITE_OTHER): Payer: Medicaid Other | Admitting: *Deleted

## 2021-09-29 ENCOUNTER — Other Ambulatory Visit: Payer: Self-pay

## 2021-09-29 DIAGNOSIS — O0993 Supervision of high risk pregnancy, unspecified, third trimester: Secondary | ICD-10-CM

## 2021-09-29 DIAGNOSIS — O10012 Pre-existing essential hypertension complicating pregnancy, second trimester: Secondary | ICD-10-CM | POA: Diagnosis not present

## 2021-09-29 NOTE — Progress Notes (Addendum)
   NURSE VISIT- NST  SUBJECTIVE:  Brooke Briggs is a 30 y.o. G46P2002 female at [redacted]w[redacted]d, here for a NST for pregnancy complicated by [facility].  She reports active fetal movement, contractions: none, vaginal bleeding: none, membranes: intact.   OBJECTIVE:  BP 114/72   Pulse 97   Wt 161 lb 4.8 oz (73.2 kg)   BMI 27.69 kg/m   Appears well, no apparent distress  No results found for this or any previous visit (from the past 24 hour(s)).  NST: FHR baseline 150 bpm, Variability: moderate, Accelerations:present, Decelerations:  Absent= Cat 1/reactive Toco: UI   ASSESSMENT: G3P2002 at [redacted]w[redacted]d with CHTN NST reactive  PLAN: EFM strip reviewed by Dr. Despina Hidden   Recommendations: keep next appointment as scheduled    [name]  09/29/2021 3:52 PM   Attestation of Attending Supervision of Nursing Visit Encounter: Evaluation and management procedures were performed by the nursing staff under my supervision and collaboration.  I have reviewed the nurse's note and chart, and I agree with the management and plan.  Rockne Coons MD Attending Physician for the Center for [facility] 09/30/2021 2:50 PM

## 2021-10-01 ENCOUNTER — Other Ambulatory Visit (HOSPITAL_COMMUNITY)
Admission: RE | Admit: 2021-10-01 | Discharge: 2021-10-01 | Disposition: A | Discharge status: Home / Self Care | Payer: Medicaid Other | Source: Ambulatory Visit | Attending: Advanced Practice Midwife | Admitting: Advanced Practice Midwife

## 2021-10-01 ENCOUNTER — Ambulatory Visit: Payer: Medicaid Other

## 2021-10-01 ENCOUNTER — Other Ambulatory Visit: Payer: Self-pay

## 2021-10-01 ENCOUNTER — Ambulatory Visit (INDEPENDENT_AMBULATORY_CARE_PROVIDER_SITE_OTHER): Payer: Medicaid Other | Admitting: Advanced Practice Midwife

## 2021-10-01 ENCOUNTER — Encounter: Payer: Self-pay | Admitting: Advanced Practice Midwife

## 2021-10-01 VITALS — BP 114/68 | HR 107 | Wt 163.2 lb

## 2021-10-01 DIAGNOSIS — O0993 Supervision of high risk pregnancy, unspecified, third trimester: Secondary | ICD-10-CM

## 2021-10-01 DIAGNOSIS — Z1389 Encounter for screening for other disorder: Secondary | ICD-10-CM

## 2021-10-01 DIAGNOSIS — O10919 Unspecified pre-existing hypertension complicating pregnancy, unspecified trimester: Secondary | ICD-10-CM

## 2021-10-01 DIAGNOSIS — Z3A36 36 weeks gestation of pregnancy: Secondary | ICD-10-CM

## 2021-10-01 DIAGNOSIS — O2343 Unspecified infection of urinary tract in pregnancy, third trimester: Secondary | ICD-10-CM

## 2021-10-01 DIAGNOSIS — A6000 Herpesviral infection of urogenital system, unspecified: Secondary | ICD-10-CM

## 2021-10-01 LAB — POCT URINALYSIS DIPSTICK OB
Blood, UA: NEGATIVE
Glucose, UA: NEGATIVE
Ketones, UA: NEGATIVE
Leukocytes, UA: NEGATIVE
Nitrite, UA: NEGATIVE
POC,PROTEIN,UA: NEGATIVE

## 2021-10-01 NOTE — Progress Notes (Signed)
   PRENATAL VISIT NOTE  Subjective:  Brooke Briggs is a 30 y.o. G3P2002 at [redacted]w[redacted]d being seen today for ongoing prenatal care.  She is currently monitored for the following issues for this high-risk pregnancy and has Recurrent genital HSV (herpes simplex virus) infection; High-risk pregnancy; and Benign essential HTN, chronic, antepartum, second trimester on their problem list.  Patient reports no complaints.  Contractions: Irritability. Vag. Bleeding: None.  Movement: Present. Denies leaking of fluid.   The following portions of the patient's history were reviewed and updated as appropriate: allergies, current medications, past family history, past medical history, past social history, past surgical history and problem list. Problem list updated.  Objective:   Vitals:   10/01/21 0937  BP: 114/68  Pulse: (!) 107  Weight: 163 lb 3.2 oz (74 kg)    Fetal Status: Fetal Heart Rate (bpm): 150 NST Fundal Height: 35 cm Movement: Present  Presentation: Vertex  General:  Alert, oriented and cooperative. Patient is in no acute distress.  Skin: Skin is warm and dry. No rash noted.   Cardiovascular: Normal heart rate noted  Respiratory: Normal respiratory effort, no problems with respiration noted  Abdomen: Soft, gravid, appropriate for gestational age.  Pain/Pressure: Absent     Pelvic: Cervical exam performed per patient request  Dilation: 3 Effacement (%): Thick Station: -3  Extremities: Normal range of motion.  Edema: None  Mental Status: Normal mood and affect. Normal behavior. Normal judgment and thought content.   Assessment and Plan:  Pregnancy: G3P2002 at [redacted]w[redacted]d  1. High-risk pregnancy in third trimester - Routine care - Reactive NST: baseline 150, mod var, +15 x 15 accels, no decels --Toco: irregular contractions not felt by patient  2. [redacted] weeks gestation of pregnancy  - Cervicovaginal ancillary only( [place]) - Strep Gp B NAA - POC Urinalysis Dipstick OB  3. Chronic  hypertension affecting pregnancy - Compliant with prescribed Labetalol 200 mg BID, well controlled  4. Recurrent genital HSV (herpes simplex virus) infection - Patient already taking suppression dose daily  5. Screening for genitourinary condition - Asymptomatic - Clean urine dip - POC Urinalysis Dipstick OB  6. UTI (urinary tract infection) during pregnancy, third trimester  - Urine Culture  Preterm labor symptoms and general obstetric precautions including but not limited to vaginal bleeding, contractions, leaking of fluid and fetal movement were reviewed in detail with the patient. Please refer to After Visit Summary for other counseling recommendations.  Return in about 1 week (around 10/08/2021) for App or MD.  Future Appointments  Date Time Provider Department Center  10/06/2021 10:10 AM [facility] NURSE [facility]  10/09/2021  9:00 AM [facility] [place] [facility] None  10/09/2021  9:50 AM Myna Hidalgo, DO [facility]  10/13/2021 10:30 AM [facility] NURSE [facility]  10/16/2021  9:00 AM [facility] [place] [facility] None  10/16/2021  9:50 AM Cheral Marker, CNM [facility]  10/20/2021 10:30 AM [facility] NURSE [facility]    Calvert Cantor, CNM

## 2021-10-03 ENCOUNTER — Encounter: Payer: Self-pay | Admitting: Advanced Practice Midwife

## 2021-10-03 LAB — STREP GP B NAA: Strep Gp B NAA: POSITIVE — AB

## 2021-10-03 LAB — CERVICOVAGINAL ANCILLARY ONLY
Chlamydia: NEGATIVE
Comment: NEGATIVE
Comment: NORMAL
Neisseria Gonorrhea: NEGATIVE

## 2021-10-04 ENCOUNTER — Encounter: Payer: Self-pay | Admitting: Advanced Practice Midwife

## 2021-10-04 DIAGNOSIS — A491 Streptococcal infection, unspecified site: Secondary | ICD-10-CM | POA: Insufficient documentation

## 2021-10-05 ENCOUNTER — Encounter: Payer: Self-pay | Admitting: Advanced Practice Midwife

## 2021-10-05 LAB — URINE CULTURE

## 2021-10-06 ENCOUNTER — Other Ambulatory Visit: Payer: Self-pay

## 2021-10-06 ENCOUNTER — Ambulatory Visit (INDEPENDENT_AMBULATORY_CARE_PROVIDER_SITE_OTHER): Payer: Medicaid Other | Admitting: *Deleted

## 2021-10-06 DIAGNOSIS — O10012 Pre-existing essential hypertension complicating pregnancy, second trimester: Secondary | ICD-10-CM

## 2021-10-06 DIAGNOSIS — O0993 Supervision of high risk pregnancy, unspecified, third trimester: Secondary | ICD-10-CM | POA: Diagnosis not present

## 2021-10-06 NOTE — Progress Notes (Addendum)
   NURSE VISIT- NST  SUBJECTIVE:  Brooke Briggs is a 30 y.o. G29P2002 female at [redacted]w[redacted]d, here for a NST for pregnancy complicated by [facility].  She reports active fetal movement, contractions: none, vaginal bleeding: none, membranes: intact.   OBJECTIVE:  BP 117/77   Pulse 95   Wt 170 lb (77.1 kg)   BMI 29.18 kg/m   Appears well, no apparent distress  Pt unable to void  NST: FHR baseline 135 bpm, Variability: moderate, Accelerations:present, Decelerations:  Absent= Cat 1/reactive Toco: none   ASSESSMENT: G3P2002 at [redacted]w[redacted]d with CHTN NST reactive  PLAN: EFM strip reviewed by Joellyn Haff, CNM, [facility]   Recommendations: keep next appointment as scheduled    [name]  10/06/2021 4:29 PM  Chart reviewed for nurse visit. Agree with plan of care.  Cheral Marker, [place] 10/06/2021 4:39 PM

## 2021-10-09 ENCOUNTER — Other Ambulatory Visit: Payer: Self-pay

## 2021-10-09 ENCOUNTER — Ambulatory Visit (INDEPENDENT_AMBULATORY_CARE_PROVIDER_SITE_OTHER): Payer: Medicaid Other

## 2021-10-09 ENCOUNTER — Ambulatory Visit (INDEPENDENT_AMBULATORY_CARE_PROVIDER_SITE_OTHER): Payer: Medicaid Other | Admitting: Obstetrics & Gynecology

## 2021-10-09 ENCOUNTER — Encounter: Payer: Self-pay | Admitting: Obstetrics & Gynecology

## 2021-10-09 VITALS — BP 120/83 | HR 100 | Wt 168.2 lb

## 2021-10-09 DIAGNOSIS — Z3A37 37 weeks gestation of pregnancy: Secondary | ICD-10-CM | POA: Diagnosis not present

## 2021-10-09 DIAGNOSIS — O0993 Supervision of high risk pregnancy, unspecified, third trimester: Secondary | ICD-10-CM | POA: Diagnosis not present

## 2021-10-09 DIAGNOSIS — R399 Unspecified symptoms and signs involving the genitourinary system: Secondary | ICD-10-CM

## 2021-10-09 DIAGNOSIS — O10919 Unspecified pre-existing hypertension complicating pregnancy, unspecified trimester: Secondary | ICD-10-CM | POA: Diagnosis not present

## 2021-10-09 DIAGNOSIS — O10012 Pre-existing essential hypertension complicating pregnancy, second trimester: Secondary | ICD-10-CM

## 2021-10-09 LAB — POCT URINALYSIS DIPSTICK OB
Blood, UA: NEGATIVE
Glucose, UA: NEGATIVE
Ketones, UA: NEGATIVE
Nitrite, UA: NEGATIVE
POC,PROTEIN,UA: NEGATIVE

## 2021-10-09 NOTE — Progress Notes (Signed)
[place] 37+1 wks,cephalic,BPP 8/8,anterior placenta gr 3,AFI 12 cm,RI .55,.49,.64=44%,EFW 3064 g 51%,FHR 144 bpm

## 2021-10-09 NOTE — Progress Notes (Addendum)
HIGH-RISK PREGNANCY VISIT Patient name: Brooke Briggs MRN [id number]  Date of birth: [birth year] Chief Complaint:   Routine Prenatal Visit and High Risk Gestation  History of Present Illness:   Brooke Briggs is a 30 y.o. G69P2002 female at [redacted]w[redacted]d with an Estimated Date of Delivery: 10/29/21 being seen today for ongoing management of a high-risk pregnancy complicated by:  -cHTN- on Labetalol 200mg  bid -UTI- []  TOC to be completed today  Today she reports  noted some irregular contractions and diarrhea . Yesterday had episode of nausea/vomiting x3- now resolved.  She does report eating yesterday which she doesn't normally do.  She was able to keep breakfast down today without issues.  Contractions: Irritability. Vag. Bleeding: None.  Movement: Present. denies leaking of fluid.   Depression screen [facility] 2/9 08/11/2021 04/02/2021 01/02/2020  Decreased Interest 0 2 0  Down, Depressed, Hopeless 0 0 0  PHQ - 2 Score 0 2 0  Altered sleeping 0 2 -  Tired, decreased energy 0 0 -  Change in appetite 0 1 -  Feeling bad or failure about yourself  0 0 -  Trouble concentrating 0 0 -  Moving slowly or fidgety/restless 0 0 -  Suicidal thoughts 0 0 -  PHQ-9 Score 0 5 -     Current Outpatient Medications  Medication Instructions   aspirin EC 81 mg, Oral, Daily, Swallow whole.   Blood Pressure Monitoring (BLOOD PRESSURE MONITOR AUTOMAT) DEVI Take BP at home daily.  Alert 04/04/2021 if >140/90 more than once.   busPIRone (BUSPAR) 10 MG tablet Oral   diphenhydrAMINE HCl (BENADRYL PO) Oral   ferrous sulfate 325 mg, Oral, Every other day   labetalol (NORMODYNE) 200 mg, Oral, 2 times daily   Prenatal Vit-Fe Fumarate-FA (PRENATAL VITAMIN PO) Oral   sertraline (ZOLOFT) 50 mg, Oral, Daily   sulfamethoxazole-trimethoprim (BACTRIM DS) 800-160 MG tablet 1 tablet, Oral, 2 times daily, X 7 days   valACYclovir (VALTREX) 500 mg, Oral, 2 times daily     Review of Systems:   Pertinent items are noted in  HPI Denies abnormal vaginal discharge w/ itching/odor/irritation, headaches, visual changes, shortness of breath, chest pain or problems with urination or bowel movements unless otherwise stated above. Pertinent History Reviewed:  Reviewed past medical,surgical, social, obstetrical and family history.  Reviewed problem list, medications and allergies. Physical Assessment:   Vitals:   10/09/21 0939  BP: 120/83  Pulse: 100  Weight: 168 lb 3.2 oz (76.3 kg)  Body mass index is 28.87 kg/m.           Physical Examination:   General appearance: alert, well appearing, and in no distress  Mental status: normal mood, behavior, speech, dress, motor activity, and thought processes  Skin: warm & dry   Extremities: Edema: None    Cardiovascular: normal heart rate noted  Respiratory: normal respiratory effort, no distress  Abdomen: gravid, soft, non-tender  Pelvic: Cervical exam performed  Dilation: 3 Effacement (%): 50 Station: -3  Fetal Status: Fetal Heart Rate (bpm): 145 Fundal Height: 36 cm Movement: Present Presentation: Vertex  Fetal Surveillance Testing today: BPP/dopplers-  cephalic,BPP 8/8,anterior placenta gr 3,AFI 12 cm,RI .55,.49,.64=44%,EFW 3064 g 51%,FHR 144 bpm  Chaperone:  pt declined     Results for orders placed or performed in visit on 10/09/21 (from the past 24 hour(s))  POC Urinalysis Dipstick OB   Collection Time: 10/09/21  9:43 AM  Result Value Ref Range   Color, UA     Clarity, UA  Glucose, UA Negative Negative   Bilirubin, UA     Ketones, UA neg    Spec Grav, UA     Blood, UA neg    pH, UA     POC,PROTEIN,UA Negative Negative, Trace, Small (1+), Moderate (2+), Large (3+), 4+   Urobilinogen, UA     Nitrite, UA neg    Leukocytes, UA Small (1+) (A) Negative   Appearance     Odor       Assessment & Plan:  High-risk pregnancy: [id number] at [redacted]w[redacted]d with an Estimated Date of Delivery: 10/29/21   1) cHTN- BP within normal range, doing well with current  meds -continue antepartum testing, completed today []  plan for IOL 38-39wk- though suspect she may go into labor on her own  2) UTI- s/p treatment, TOC today  Meds: No orders of the defined types were placed in this encounter.   Labs/procedures today: UCX  Treatment Plan:  continue care as outlined above  Reviewed: Term labor symptoms and general obstetric precautions including but not limited to vaginal bleeding, contractions, leaking of fluid and fetal movement were reviewed in detail with the patient.  All questions were answered. Pt has home bp cuff. Check bp weekly, let us know if >140/90.   Follow-up: Return in about 1 week (around 10/16/2021) for [place] visit as scheduled.   Future Appointments  Date Time Provider [place]  10/13/2021 10:30 AM [facility] NURSE [facility]  10/16/2021  9:00 AM [place] - FTOBGYN [place] [facility]-FTIMG None  10/16/2021  9:50 AM Roma Schanz, CNM [facility]  10/20/2021 10:30 AM [facility] NURSE [facility]    Orders Placed This Encounter  Procedures   Urine Culture   POC Urinalysis Dipstick OB    Janyth Pupa, DO Attending [place], [place] for [employer], [place]

## 2021-10-11 LAB — URINE CULTURE

## 2021-10-12 ENCOUNTER — Encounter (HOSPITAL_COMMUNITY): Payer: Self-pay | Admitting: Obstetrics and Gynecology

## 2021-10-12 ENCOUNTER — Other Ambulatory Visit: Payer: Self-pay

## 2021-10-12 ENCOUNTER — Inpatient Hospital Stay (HOSPITAL_COMMUNITY)
Admission: AD | Admit: 2021-10-12 | Discharge: 2021-10-12 | Disposition: A | Discharge status: Home / Self Care | Payer: Medicaid Other | Attending: Obstetrics and Gynecology | Admitting: Obstetrics and Gynecology

## 2021-10-12 DIAGNOSIS — O10012 Pre-existing essential hypertension complicating pregnancy, second trimester: Secondary | ICD-10-CM

## 2021-10-12 DIAGNOSIS — O471 False labor at or after 37 completed weeks of gestation: Secondary | ICD-10-CM | POA: Diagnosis present

## 2021-10-12 DIAGNOSIS — Z3A37 37 weeks gestation of pregnancy: Secondary | ICD-10-CM | POA: Diagnosis not present

## 2021-10-12 DIAGNOSIS — Z0371 Encounter for suspected problem with amniotic cavity and membrane ruled out: Secondary | ICD-10-CM

## 2021-10-12 LAB — POCT FERN TEST: POCT Fern Test: NEGATIVE

## 2021-10-12 LAB — AMNISURE RUPTURE OF MEMBRANE (ROM) NOT AT ARMC: Amnisure ROM: NEGATIVE

## 2021-10-12 NOTE — MAU Provider Note (Signed)
Event Date/Time   First Provider Initiated Contact with Patient 10/12/21 1319       S: Ms. Brooke Briggs is a 30 y.o. [id number] at [redacted]w[redacted]d  who presents to MAU today complaining of leaking of fluid since noon. She denies vaginal bleeding. She denies contractions. She reports normal fetal movement.    O: BP 117/87   Pulse 96   Temp 98.3 F (36.8 C) (Oral)   Resp 16   SpO2 100%  GENERAL: Well-developed, well-nourished female in no acute distress.  HEAD: Normocephalic, atraumatic.  CHEST: Normal effort of breathing, regular heart rate ABDOMEN: Soft, nontender, gravid PELVIC: Normal external female genitalia. Vagina is pink and rugated. Cervix with normal contour, no lesions. Normal discharge.  No pooling.   Cervical exam: Deferred as patient is having no pain   Fetal Monitoring: Baseline: 130 Variability: moderate Accelerations: 15x15 Decelerations: none Contractions: UI  Results for orders placed or performed during the hospital encounter of 10/12/21 (from the past 24 hour(s))  Amnisure rupture of membrane (rom)not at [facility]     Status: None   Collection Time: 10/12/21  1:28 PM  Result Value Ref Range   Amnisure ROM NEGATIVE   POCT fern test     Status: Normal   Collection Time: 10/12/21  1:30 PM  Result Value Ref Range   POCT Fern Test Negative = intact amniotic membranes      A: SIUP at [redacted]w[redacted]d  Membranes intact  P: -Discharge home in stable condition -Labor precautions discussed -Patient advised to follow-up with OB as scheduled for prenatal care -Patient may return to MAU as needed or if her condition were to change or worsen   Rolm Bookbinder, [place] 10/12/2021 4:03 PM

## 2021-10-12 NOTE — MAU Note (Signed)
Pt reports gush of fluid around 2 am, then at 12noon she had another gush of fluid. Occasional uc. Reports positive fetal movement.

## 2021-10-13 ENCOUNTER — Other Ambulatory Visit: Payer: Medicaid Other

## 2021-10-16 ENCOUNTER — Other Ambulatory Visit: Payer: Self-pay

## 2021-10-16 ENCOUNTER — Ambulatory Visit (INDEPENDENT_AMBULATORY_CARE_PROVIDER_SITE_OTHER): Payer: Medicaid Other | Admitting: Women's Health

## 2021-10-16 ENCOUNTER — Encounter: Payer: Self-pay | Admitting: Women's Health

## 2021-10-16 ENCOUNTER — Ambulatory Visit (INDEPENDENT_AMBULATORY_CARE_PROVIDER_SITE_OTHER): Payer: Medicaid Other

## 2021-10-16 ENCOUNTER — Other Ambulatory Visit: Payer: Self-pay | Admitting: Advanced Practice Midwife

## 2021-10-16 ENCOUNTER — Other Ambulatory Visit (HOSPITAL_COMMUNITY): Payer: Self-pay | Admitting: Advanced Practice Midwife

## 2021-10-16 VITALS — BP 132/91 | HR 81 | Wt 169.0 lb

## 2021-10-16 DIAGNOSIS — O0993 Supervision of high risk pregnancy, unspecified, third trimester: Secondary | ICD-10-CM

## 2021-10-16 DIAGNOSIS — O10919 Unspecified pre-existing hypertension complicating pregnancy, unspecified trimester: Secondary | ICD-10-CM

## 2021-10-16 DIAGNOSIS — O10012 Pre-existing essential hypertension complicating pregnancy, second trimester: Secondary | ICD-10-CM

## 2021-10-16 DIAGNOSIS — Z3A38 38 weeks gestation of pregnancy: Secondary | ICD-10-CM

## 2021-10-16 LAB — POCT URINALYSIS DIPSTICK OB
Blood, UA: NEGATIVE
Glucose, UA: NEGATIVE
Ketones, UA: NEGATIVE
Nitrite, UA: NEGATIVE

## 2021-10-16 NOTE — Progress Notes (Signed)
[place] 38+1 wks,cephalic,BPP 8/8,FHR 140 bpm,anterior placenta gr 3,AFI 15.3 cm,RI .53,.46=27%

## 2021-10-16 NOTE — Progress Notes (Signed)
HIGH-RISK PREGNANCY VISIT Patient name: Brooke Briggs MRN [id number]  Date of birth: [birth year] Chief Complaint:   High Risk Gestation ([place] today)  History of Present Illness:   Brooke Briggs is a 30 y.o. G63P2002 female at [redacted]w[redacted]d with an Estimated Date of Delivery: 10/29/21 being seen today for ongoing management of a high-risk pregnancy complicated by chronic hypertension currently on labetalol 200mg  BID.    Today she reports no complaints. Hasn't taken labetalol this am.  Contractions: Irritability. Vag. Bleeding: None.  Movement: Present. denies leaking of fluid.   Depression screen [facility] 2/9 08/11/2021 04/02/2021 01/02/2020  Decreased Interest 0 2 0  Down, Depressed, Hopeless 0 0 0  PHQ - 2 Score 0 2 0  Altered sleeping 0 2 -  Tired, decreased energy 0 0 -  Change in appetite 0 1 -  Feeling bad or failure about yourself  0 0 -  Trouble concentrating 0 0 -  Moving slowly or fidgety/restless 0 0 -  Suicidal thoughts 0 0 -  PHQ-9 Score 0 5 -     GAD 7 : Generalized Anxiety Score 08/11/2021 04/02/2021  Nervous, Anxious, on Edge 0 3  Control/stop worrying 0 2  Worry too much - different things 0 2  Trouble relaxing 0 2  Restless 0 0  Easily annoyed or irritable 0 2  Afraid - awful might happen 0 2  Total GAD 7 Score 0 13     Review of Systems:   Pertinent items are noted in HPI Denies abnormal vaginal discharge w/ itching/odor/irritation, headaches, visual changes, shortness of breath, chest pain, abdominal pain, severe nausea/vomiting, or problems with urination or bowel movements unless otherwise stated above. Pertinent History Reviewed:  Reviewed past medical,surgical, social, obstetrical and family history.  Reviewed problem list, medications and allergies. Physical Assessment:   Vitals:   10/16/21 1030  BP: (!) 132/91  Pulse: 81  Weight: 169 lb (76.7 kg)  Body mass index is 29.01 kg/m.           Physical Examination:   General appearance: alert, well appearing, and in no  distress  Mental status: alert, oriented to person, place, and time  Skin: warm & dry   Extremities: Edema: None    Cardiovascular: normal heart rate noted  Respiratory: normal respiratory effort, no distress  Abdomen: gravid, soft, non-tender  Pelvic: Cervical exam performed  Dilation: 3.5 Effacement (%): 50 Station: -2  Fetal Status: Fetal Heart Rate (bpm): 140 u/s   Movement: Present Presentation: Vertex  Fetal Surveillance Testing today: 14/08/22 38+1 wks,cephalic,BPP 8/8,FHR 140 bpm,anterior placenta gr 3,AFI 15.3 cm,RI .53,.46=27%  Chaperone: [place]    Results for orders placed or performed in visit on 10/16/21 (from the past 24 hour(s))  POC Urinalysis Dipstick OB   Collection Time: 10/16/21 10:33 AM  Result Value Ref Range   Color, UA     Clarity, UA     Glucose, UA Negative Negative   Bilirubin, UA     Ketones, UA neg    Spec Grav, UA     Blood, UA neg    pH, UA     POC,PROTEIN,UA Trace Negative, Trace, Small (1+), Moderate (2+), Large (3+), 4+   Urobilinogen, UA     Nitrite, UA neg    Leukocytes, UA Moderate (2+) (A) Negative   Appearance     Odor      Assessment & Plan:  High-risk pregnancy: 14/08/22 at [redacted]w[redacted]d with an Estimated Date of Delivery: 10/29/21   1) CHTN, stable,  labetalol 200mg  BID (will take am dose when she gets home), reviewed pre-e s/s, reasons to seek care  Meds: No orders of the defined types were placed in this encounter.   Labs/procedures today: SVE and U/S  Treatment Plan:  nst Monday, IOL Wed,  IOL form faxed via Epic and orders placed   Reviewed: Term labor symptoms and general obstetric precautions including but not limited to vaginal bleeding, contractions, leaking of fluid and fetal movement were reviewed in detail with the patient.  All questions were answered. Does have home bp cuff. Office bp cuff given: not applicable. Check bp daily, let Saturday know if consistently >150 and/or >95.  Follow-up: Return for As scheduled.   Future  Appointments  Date Time Provider Department Center  10/20/2021 10:30 AM [facility]FT[facility] NURSE [facility]  10/22/2021  7:00 AM [facility]-LD SCHED ROOM [facility]-INDC None    Orders Placed This Encounter  Procedures   POC Urinalysis Dipstick OB   10/24/2021 CNM, WHNP-BC 10/16/2021 11:12 AM

## 2021-10-16 NOTE — Patient Instructions (Signed)
Ardean Larsen, thank you for choosing our office today! We appreciate the opportunity to meet your healthcare needs. You may receive a short survey by mail, e-mail, or through [employer]. If you are happy with your care we would appreciate if you could take just a few minutes to complete the survey questions. We read all of your comments and take your feedback very seriously. Thank you again for choosing our office.  Center for [name] Team at [facility]  [facility] ([address] [place], [place] [address]) Entrance C, located off of [address] Free 24/7 valet parking   CLASSES: Go to [email or web address] to register for classes (childbirth, breastfeeding, waterbirth, infant CPR, daddy bootcamp, etc.)  Call the office [phone]) or go to [facility] if: You begin to have strong, frequent contractions Your water breaks.  Sometimes it is a big gush of fluid, sometimes it is just a trickle that keeps getting your panties wet or running down your legs You have vaginal bleeding.  It is normal to have a small amount of spotting if your cervix was checked.  You don't feel your baby moving like normal.  If you don't, get you something to eat and drink and lay down and focus on feeling your baby move.   If your baby is still not moving like normal, you should call the office or go to [facility].  Call the office [phone]) or go to [facility] hospital for these signs of pre-eclampsia: Severe headache that does not go away with Tylenol Visual changes- seeing spots, double, blurred vision Pain under your right breast or upper abdomen that does not go away with Tums or heartburn medicine Nausea and/or vomiting Severe swelling in your hands, feet, and face   [facility] Pediatricians/Family Doctors [facility]): [address] Colette Ribas, [phone]           [facility]: [address], [phone]                 [facility]): [address], [phone] (call to ask if accepting patients) [facility] Department: [address], [place], [phone]    [facility] Pediatricians/Family Doctors [facility]): [address], [phone] [facility]: [address] [place], [phone] [facility] [place]: [address], [phone]  [facility] Doctors  [facility]): [phone] [facility]: [address] [phone]   [facility] Doctors [facility]: [address] [phone]  [facility] Doctors  [facility]: [phone], [phone]  Home Blood Pressure Monitoring for Patients   Your provider has recommended that you check your blood pressure (BP) at least once a week at home. If you do not have a blood pressure cuff at home, one will be provided for you. Contact your provider if you have not received your monitor within 1 week.   Helpful Tips for Accurate Home Blood Pressure Checks  Don't smoke, exercise, or drink caffeine 30 minutes before checking your BP Use the restroom before checking your BP (a full bladder can raise your pressure) Relax in a comfortable upright chair Feet on the ground Left arm resting comfortably on a flat surface at the level of your heart Legs uncrossed Back supported Sit quietly and don't talk Place the cuff on your bare arm Adjust snuggly, so that only two fingertips  can fit between your skin and the top of the cuff Check 2 readings separated by at least one minute Keep a log of your BP readings For a visual, please reference this diagram: http://ccnc.care/bpdiagram  Provider Name: [facility]     Phone: [phone]  Zone 1: ALL CLEAR  Continue to monitor your symptoms:  BP reading is less than 140 (top number) or less than 90 (bottom number)  No right  upper stomach pain No headaches or seeing spots No feeling nauseated or throwing up No swelling in face and hands  Zone 2: CAUTION Call your doctor's office for any of the following:  BP reading is greater than 140 (top number) or greater than 90 (bottom number)  Stomach pain under your ribs in the middle or right side Headaches or seeing spots Feeling nauseated or throwing up Swelling in face and hands  Zone 3: EMERGENCY  Seek immediate medical care if you have any of the following:  BP reading is greater than160 (top number) or greater than 110 (bottom number) Severe headaches not improving with Tylenol Serious difficulty catching your breath Any worsening symptoms from Zone 2   Braxton Hicks Contractions Contractions of the uterus can occur throughout pregnancy, but they are not always a sign that you are in labor. You may have practice contractions called Braxton Hicks contractions. These false labor contractions are sometimes confused with true labor. What are [place] contractions? Braxton Hicks contractions are tightening movements that occur in the muscles of the uterus before labor. Unlike true labor contractions, these contractions do not result in opening (dilation) and thinning of the cervix. Toward the end of pregnancy (32-34 weeks), Braxton Hicks contractions can happen more often and may become stronger. These contractions are sometimes difficult to tell apart from true labor because they can be very uncomfortable. You should not feel embarrassed if you go to the hospital with false labor. Sometimes, the only way to tell if you are in true labor is for your health care provider to look for changes in the cervix. The health care provider will do a physical exam and may monitor your contractions. If you are not in true labor, the exam should show that your cervix is not dilating and your water has not broken. If there are no other health problems associated with your  pregnancy, it is completely safe for you to be sent home with false labor. You may continue to have Braxton Hicks contractions until you go into true labor. How to tell the difference between true labor and false labor True labor Contractions last 30-70 seconds. Contractions become very regular. Discomfort is usually felt in the top of the uterus, and it spreads to the lower abdomen and low back. Contractions do not go away with walking. Contractions usually become more intense and increase in frequency. The cervix dilates and gets thinner. False labor Contractions are usually shorter and not as strong as true labor contractions. Contractions are usually irregular. Contractions are often felt in the front of the lower abdomen and in the groin. Contractions may go away when you walk around or change positions while lying down. Contractions get weaker and are shorter-lasting as time goes on. The cervix usually does not dilate or become thin. Follow these instructions at home:  Take over-the-counter and prescription medicines only as told by your health care provider. Keep up with your usual exercises and follow other instructions from your health care provider. Eat and drink lightly if you think  you are going into labor. If Braxton Hicks contractions are making you uncomfortable: Change your position from lying down or resting to walking, or change from walking to resting. Sit and rest in a tub of warm water. Drink enough fluid to keep your urine pale yellow. Dehydration may cause these contractions. Do slow and deep breathing several times an hour. Keep all follow-up prenatal visits as told by your health care provider. This is important. Contact a health care provider if: You have a fever. You have continuous pain in your abdomen. Get help right away if: Your contractions become stronger, more regular, and closer together. You have fluid leaking or gushing from your vagina. You pass  blood-tinged mucus (bloody show). You have bleeding from your vagina. You have low back pain that you never had before. You feel your baby's head pushing down and causing pelvic pressure. Your baby is not moving inside you as much as it used to. Summary Contractions that occur before labor are called Braxton Hicks contractions, false labor, or practice contractions. Braxton Hicks contractions are usually shorter, weaker, farther apart, and less regular than true labor contractions. True labor contractions usually become progressively stronger and regular, and they become more frequent. Manage discomfort from [facility] contractions by changing position, resting in a warm bath, drinking plenty of water, or practicing deep breathing. This information is not intended to replace advice given to you by your health care provider. Make sure you discuss any questions you have with your health care provider. Document Revised: 10/08/2017 Document Reviewed: 03/11/2017 Elsevier Patient Education  [place].

## 2021-10-20 ENCOUNTER — Other Ambulatory Visit: Payer: Medicaid Other

## 2021-10-22 ENCOUNTER — Encounter (HOSPITAL_COMMUNITY): Payer: Self-pay | Admitting: Obstetrics & Gynecology

## 2021-10-22 ENCOUNTER — Inpatient Hospital Stay (HOSPITAL_COMMUNITY): Payer: Medicaid Other

## 2021-10-22 ENCOUNTER — Inpatient Hospital Stay (HOSPITAL_COMMUNITY)
Admission: AD | Admit: 2021-10-22 | Discharge: 2021-10-23 | DRG: 806 | Disposition: A | Discharge status: Home / Self Care | Payer: Medicaid Other | Attending: Obstetrics and Gynecology | Admitting: Obstetrics and Gynecology

## 2021-10-22 ENCOUNTER — Other Ambulatory Visit: Payer: Self-pay

## 2021-10-22 DIAGNOSIS — A6000 Herpesviral infection of urogenital system, unspecified: Secondary | ICD-10-CM | POA: Diagnosis present

## 2021-10-22 DIAGNOSIS — O326XX0 Maternal care for compound presentation, not applicable or unspecified: Secondary | ICD-10-CM

## 2021-10-22 DIAGNOSIS — Z3A39 39 weeks gestation of pregnancy: Secondary | ICD-10-CM

## 2021-10-22 DIAGNOSIS — Z20822 Contact with and (suspected) exposure to covid-19: Secondary | ICD-10-CM | POA: Diagnosis present

## 2021-10-22 DIAGNOSIS — F4310 Post-traumatic stress disorder, unspecified: Secondary | ICD-10-CM | POA: Diagnosis present

## 2021-10-22 DIAGNOSIS — F419 Anxiety disorder, unspecified: Secondary | ICD-10-CM | POA: Diagnosis present

## 2021-10-22 DIAGNOSIS — O99344 Other mental disorders complicating childbirth: Secondary | ICD-10-CM | POA: Diagnosis present

## 2021-10-22 DIAGNOSIS — O99820 Streptococcus B carrier state complicating pregnancy: Secondary | ICD-10-CM | POA: Diagnosis not present

## 2021-10-22 DIAGNOSIS — O9832 Other infections with a predominantly sexual mode of transmission complicating childbirth: Secondary | ICD-10-CM | POA: Diagnosis present

## 2021-10-22 DIAGNOSIS — O99824 Streptococcus B carrier state complicating childbirth: Secondary | ICD-10-CM | POA: Diagnosis present

## 2021-10-22 DIAGNOSIS — O1002 Pre-existing essential hypertension complicating childbirth: Secondary | ICD-10-CM | POA: Diagnosis present

## 2021-10-22 DIAGNOSIS — O1490 Unspecified pre-eclampsia, unspecified trimester: Secondary | ICD-10-CM

## 2021-10-22 DIAGNOSIS — F32A Depression, unspecified: Secondary | ICD-10-CM | POA: Diagnosis present

## 2021-10-22 DIAGNOSIS — A491 Streptococcal infection, unspecified site: Secondary | ICD-10-CM | POA: Diagnosis present

## 2021-10-22 DIAGNOSIS — I10 Essential (primary) hypertension: Secondary | ICD-10-CM | POA: Diagnosis present

## 2021-10-22 DIAGNOSIS — O9852 Other viral diseases complicating childbirth: Secondary | ICD-10-CM

## 2021-10-22 LAB — CBC
HCT: 32.6 % — ABNORMAL LOW (ref 36.0–46.0)
Hemoglobin: 10.6 g/dL — ABNORMAL LOW (ref 12.0–15.0)
MCH: 28.4 pg (ref 26.0–34.0)
MCHC: 32.5 g/dL (ref 30.0–36.0)
MCV: 87.4 fL (ref 80.0–100.0)
Platelets: 311 10*3/uL (ref 150–400)
RBC: 3.73 MIL/uL — ABNORMAL LOW (ref 3.87–5.11)
RDW: 14.7 % (ref 11.5–15.5)
WBC: 12.6 10*3/uL — ABNORMAL HIGH (ref 4.0–10.5)
nRBC: 0 % (ref 0.0–0.2)

## 2021-10-22 LAB — TYPE AND SCREEN
ABO/RH(D): A POS
Antibody Screen: NEGATIVE

## 2021-10-22 LAB — RESP PANEL BY RT-PCR (FLU A&B, COVID) ARPGX2
Influenza A by PCR: POSITIVE — AB
Influenza B by PCR: NEGATIVE
SARS Coronavirus 2 by RT PCR: NEGATIVE

## 2021-10-22 LAB — RPR: RPR Ser Ql: NONREACTIVE

## 2021-10-22 MED ORDER — SODIUM CHLORIDE 0.9 % IV SOLN
5.0000 10*6.[IU] | Freq: Once | INTRAVENOUS | Status: AC
  Administered 2021-10-22: 08:00:00 5 10*6.[IU] via INTRAVENOUS
  Filled 2021-10-22: qty 5

## 2021-10-22 MED ORDER — ACETAMINOPHEN 325 MG PO TABS: 650.0000 mg | ORAL_TABLET | ORAL | Status: DC | PRN

## 2021-10-22 MED ORDER — PRENATAL MULTIVITAMIN CH: 1.0000 | ORAL_TABLET | Freq: Every day | ORAL | Status: DC

## 2021-10-22 MED ORDER — SOD CITRATE-CITRIC ACID 500-334 MG/5ML PO SOLN: 30.0000 mL | ORAL | Status: DC | PRN

## 2021-10-22 MED ORDER — LIDOCAINE HCL (PF) 1 % IJ SOLN: 30.0000 mL | INTRAMUSCULAR | Status: DC | PRN

## 2021-10-22 MED ORDER — OXYTOCIN-SODIUM CHLORIDE 30-0.9 UT/500ML-% IV SOLN
2.5000 [IU]/h | INTRAVENOUS | Status: DC
  Administered 2021-10-22: 13:00:00 2.5 [IU]/h via INTRAVENOUS

## 2021-10-22 MED ORDER — LABETALOL HCL 200 MG PO TABS: 200.0000 mg | ORAL_TABLET | Freq: Two times a day (BID) | ORAL | Status: DC

## 2021-10-22 MED ORDER — TERBUTALINE SULFATE 1 MG/ML IJ SOLN: 0.2500 mg | Freq: Once | INTRAMUSCULAR | Status: DC | PRN

## 2021-10-22 MED ORDER — BUSPIRONE HCL 10 MG PO TABS
10.0000 mg | ORAL_TABLET | Freq: Every day | ORAL | Status: DC
  Administered 2021-10-23: 10 mg via ORAL
  Filled 2021-10-22: qty 1

## 2021-10-22 MED ORDER — SENNOSIDES-DOCUSATE SODIUM 8.6-50 MG PO TABS
2.0000 | ORAL_TABLET | ORAL | Status: DC
  Administered 2021-10-22: 19:00:00 2 via ORAL
  Filled 2021-10-22: qty 2

## 2021-10-22 MED ORDER — WITCH HAZEL-GLYCERIN EX PADS: 1.0000 "application " | MEDICATED_PAD | CUTANEOUS | Status: DC | PRN

## 2021-10-22 MED ORDER — FLEET ENEMA 7-19 GM/118ML RE ENEM: 1.0000 | ENEMA | RECTAL | Status: DC | PRN

## 2021-10-22 MED ORDER — OXYTOCIN-SODIUM CHLORIDE 30-0.9 UT/500ML-% IV SOLN
1.0000 m[IU]/min | INTRAVENOUS | Status: DC
  Administered 2021-10-22: 08:00:00 2 m[IU]/min via INTRAVENOUS
  Filled 2021-10-22: qty 500

## 2021-10-22 MED ORDER — PHENYLEPHRINE 40 MCG/ML (10ML) SYRINGE FOR IV PUSH (FOR BLOOD PRESSURE SUPPORT): 80.0000 ug | PREFILLED_SYRINGE | INTRAVENOUS | Status: DC | PRN

## 2021-10-22 MED ORDER — DIPHENHYDRAMINE HCL 50 MG/ML IJ SOLN: 12.5000 mg | INTRAMUSCULAR | Status: DC | PRN

## 2021-10-22 MED ORDER — LACTATED RINGERS IV SOLN: 500.0000 mL | Freq: Once | INTRAVENOUS | Status: DC

## 2021-10-22 MED ORDER — EPHEDRINE 5 MG/ML INJ: 10.0000 mg | INTRAVENOUS | Status: DC | PRN

## 2021-10-22 MED ORDER — SIMETHICONE 80 MG PO CHEW: 80.0000 mg | CHEWABLE_TABLET | ORAL | Status: DC | PRN

## 2021-10-22 MED ORDER — MEDROXYPROGESTERONE ACETATE 150 MG/ML IM SUSP
150.0000 mg | INTRAMUSCULAR | Status: AC | PRN
  Administered 2021-10-23: 150 mg via INTRAMUSCULAR
  Filled 2021-10-22: qty 1

## 2021-10-22 MED ORDER — DIBUCAINE (PERIANAL) 1 % EX OINT: 1.0000 "application " | TOPICAL_OINTMENT | CUTANEOUS | Status: DC | PRN

## 2021-10-22 MED ORDER — ONDANSETRON HCL 4 MG PO TABS: 4.0000 mg | ORAL_TABLET | ORAL | Status: DC | PRN

## 2021-10-22 MED ORDER — OXYTOCIN BOLUS FROM INFUSION
333.0000 mL | Freq: Once | INTRAVENOUS | Status: AC
  Administered 2021-10-22: 13:00:00 333 mL via INTRAVENOUS

## 2021-10-22 MED ORDER — FERROUS SULFATE 325 (65 FE) MG PO TABS
325.0000 mg | ORAL_TABLET | Freq: Every day | ORAL | Status: DC
  Administered 2021-10-23: 325 mg via ORAL
  Filled 2021-10-22: qty 1

## 2021-10-22 MED ORDER — ONDANSETRON HCL 4 MG/2ML IJ SOLN: 4.0000 mg | INTRAMUSCULAR | Status: DC | PRN

## 2021-10-22 MED ORDER — ZOLPIDEM TARTRATE 5 MG PO TABS: 5.0000 mg | ORAL_TABLET | Freq: Every evening | ORAL | Status: DC | PRN

## 2021-10-22 MED ORDER — IBUPROFEN 600 MG PO TABS
600.0000 mg | ORAL_TABLET | Freq: Four times a day (QID) | ORAL | Status: DC
  Administered 2021-10-22 – 2021-10-23 (×4): 600 mg via ORAL
  Filled 2021-10-22 (×4): qty 1

## 2021-10-22 MED ORDER — TETANUS-DIPHTH-ACELL PERTUSSIS 5-2.5-18.5 LF-MCG/0.5 IM SUSY: 0.5000 mL | PREFILLED_SYRINGE | Freq: Once | INTRAMUSCULAR | Status: DC

## 2021-10-22 MED ORDER — LACTATED RINGERS IV SOLN: INTRAVENOUS | Status: DC

## 2021-10-22 MED ORDER — COCONUT OIL OIL
1.0000 "application " | TOPICAL_OIL | Status: DC | PRN
  Administered 2021-10-22: 1 via TOPICAL

## 2021-10-22 MED ORDER — LABETALOL HCL 200 MG PO TABS
200.0000 mg | ORAL_TABLET | Freq: Two times a day (BID) | ORAL | Status: DC
  Administered 2021-10-22: 08:00:00 200 mg via ORAL
  Filled 2021-10-22: qty 1

## 2021-10-22 MED ORDER — SERTRALINE HCL 50 MG PO TABS
50.0000 mg | ORAL_TABLET | Freq: Every day | ORAL | Status: DC
  Administered 2021-10-23: 50 mg via ORAL
  Filled 2021-10-22: qty 1

## 2021-10-22 MED ORDER — PENICILLIN G POT IN DEXTROSE 60000 UNIT/ML IV SOLN
3.0000 10*6.[IU] | INTRAVENOUS | Status: DC
  Administered 2021-10-22: 13:00:00 3 10*6.[IU] via INTRAVENOUS
  Filled 2021-10-22 (×4): qty 50

## 2021-10-22 MED ORDER — HYDROXYZINE HCL 50 MG PO TABS: 50.0000 mg | ORAL_TABLET | Freq: Four times a day (QID) | ORAL | Status: DC | PRN

## 2021-10-22 MED ORDER — ONDANSETRON HCL 4 MG/2ML IJ SOLN: 4.0000 mg | Freq: Four times a day (QID) | INTRAMUSCULAR | Status: DC | PRN

## 2021-10-22 MED ORDER — FENTANYL-BUPIVACAINE-NACL 0.5-0.125-0.9 MG/250ML-% EP SOLN: 12.0000 mL/h | EPIDURAL | Status: DC | PRN

## 2021-10-22 MED ORDER — BENZOCAINE-MENTHOL 20-0.5 % EX AERO: 1.0000 "application " | INHALATION_SPRAY | CUTANEOUS | Status: DC | PRN

## 2021-10-22 MED ORDER — DIPHENHYDRAMINE HCL 25 MG PO CAPS: 25.0000 mg | ORAL_CAPSULE | Freq: Four times a day (QID) | ORAL | Status: DC | PRN

## 2021-10-22 MED ORDER — VALACYCLOVIR HCL 500 MG PO TABS
500.0000 mg | ORAL_TABLET | Freq: Two times a day (BID) | ORAL | Status: DC
  Administered 2021-10-23: 500 mg via ORAL
  Filled 2021-10-22: qty 1

## 2021-10-22 MED ORDER — NIFEDIPINE ER OSMOTIC RELEASE 30 MG PO TB24
30.0000 mg | ORAL_TABLET | Freq: Every day | ORAL | Status: DC
  Administered 2021-10-22 – 2021-10-23 (×2): 30 mg via ORAL
  Filled 2021-10-22 (×2): qty 1

## 2021-10-22 MED ORDER — LACTATED RINGERS IV SOLN: 500.0000 mL | INTRAVENOUS | Status: DC | PRN

## 2021-10-22 MED ORDER — FENTANYL CITRATE (PF) 100 MCG/2ML IJ SOLN: 100.0000 ug | INTRAMUSCULAR | Status: DC | PRN

## 2021-10-22 NOTE — Progress Notes (Signed)
Brooke Briggs MRN: [id number]  Subjective: -Care assumed of 30 y.o. G3P2002 at [redacted]w[redacted]d who presents for IOL s/t CHTN. Pregnancy and medical history significant for CHTN on Meds.  In room to meet acquaintance of patient; no family/support currently at bedside.  Patient reports she did not take her labetalol this morning.  Plans for non-pharmacological methods for pain management. No questions or concerns   Objective: BP (!) 138/109    Pulse 90    Resp 16    Ht 5\' 5"  (1.651 m)    Wt 76.7 kg    BMI 28.12 kg/m  No intake/output data recorded. No intake/output data recorded.  Fetal Monitoring: FHT: 150 bpm, Mod Var, -Decels, +10x10 Accels UC: None graphed    Vaginal Exam: SVE:   Dilation: 5 Effacement (%): 80 Station: -2 Exam by:: booker,cnm Membranes:Intact Internal Monitors: None  Augmentation/Induction: Pitocin:85mUn/min Cytotec: None  Assessment:  IUP at 39 weeks Cat I FT  IOL CHTN   Plan: -Reviewed plan and support given for un-medicated delivery -Informed that labetalol ordered for this am. -Patient without q/c or current needs -Continue other mgmt as ordered   3m, CNM Advanced Practice Provider, Center for [facility] 10/22/2021, 8:25 AM  Addendum 9:53 AM  -Nurse call reports patient influenza positive type A -Droplet precautions initiated.

## 2021-10-22 NOTE — H&P (Signed)
Brooke Briggs is a 30 y.o. G25P2002 female at [redacted]w[redacted]d by 20wk u/s presenting for IOL for CHTN.   Reports active fetal movement, contractions: none, vaginal bleeding: none, membranes: intact.  Initiated prenatal care at [facility] at 8wks then transferred to [facility] at 25 wks.   Most recent u/s 10/16/21: bpp 8/8, dopp 27%, vtx 10/09/21 @ 37wks, EFW 55%.   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, no recent outbreaks  Prenatal History/Complications:  none  Past Medical History: Past Medical History:  Diagnosis Date   Anxiety    Depression    HSV-2 infection    PTSD (post-traumatic stress disorder)    Vaginal Pap smear, abnormal     Past Surgical History: Past Surgical History:  Procedure Laterality Date   NO PAST SURGERIES      Obstetrical History: OB History     Gravida  3   Para  2   Term  2   Preterm      AB      Living  2      SAB      IAB      Ectopic      Multiple  0   Live Births  2           Social History: Social History   Socioeconomic History   Marital status: Single    Spouse name: Not on file   Number of children: 1   Years of education: 13   Highest education level: High school graduate  Occupational History   Occupation: unemployed  Tobacco Use   Smoking status: Never   Smokeless tobacco: Never  Vaping Use   Vaping Use: Never used  Substance and Sexual Activity   Alcohol use: Not Currently   Drug use: Not Currently    Types: Marijuana   Sexual activity: Yes    Birth control/protection: None  Other Topics Concern   Not on file  Social History Narrative   Not on file   Social Determinants of Health   Financial Resource Strain: Medium Risk   Difficulty of Paying Living Expenses: Somewhat hard  Food Insecurity: Food Insecurity Present   Worried About Running Out of Food in the Last Year: Sometimes true   Ran Out of Food in the Last Year: Sometimes true  Transportation Needs: No Transportation Needs   Lack of  Transportation (Medical): No   Lack of Transportation (Non-Medical): No  Physical Activity: Sufficiently Active   Days of Exercise per Week: 4 days   Minutes of Exercise per Session: 40 min  Stress: No Stress Concern Present   Feeling of Stress : Not at all  Social Connections: Moderately Integrated   Frequency of Communication with Friends and Family: More than three times a week   Frequency of Social Gatherings with Friends and Family: More than three times a week   Attends Religious Services: 1 to 4 times per year   Active Member of [employer] or Organizations: No   Attends Banker Meetings: Never   Marital Status: Living with partner    Family History: Family History  Problem Relation Age of Onset   Depression Mother    Anxiety disorder Mother    ADD / ADHD Mother    Hypertension Mother    Cancer Father        prostate   Hypertension Father    Hypertension Maternal Aunt    Hypertension Maternal Grandmother    Cancer Maternal Grandfather  prostate   Hypertension Paternal Grandmother    Cancer Paternal Grandmother    Hypertension Paternal Grandfather    Heart disease Paternal Grandfather     Allergies: Allergies  Allergen Reactions   Dilaudid [Hydromorphone Hcl] Nausea And Vomiting   Hydromorphone Other (See Comments)    Medications Prior to Admission  Medication Sig Dispense Refill Last Dose   aspirin EC 81 MG tablet Take 1 tablet (81 mg total) by mouth daily. Swallow whole. 90 tablet 11    Blood Pressure Monitoring (BLOOD PRESSURE MONITOR AUTOMAT) DEVI Take BP at home daily.  Alert [place] if >140/90 more than once. (Patient not taking: Reported on 10/16/2021) 1 each 0    busPIRone (BUSPAR) 10 MG tablet Take by mouth.      ferrous sulfate 325 (65 FE) MG tablet Take 1 tablet (325 mg total) by mouth every other day. 45 tablet 2    labetalol (NORMODYNE) 200 MG tablet TAKE 1 TABLET BY MOUTH TWICE A DAY 180 tablet 1    Prenatal Vit-Fe Fumarate-FA (PRENATAL  VITAMIN PO) Take by mouth.      sertraline (ZOLOFT) 50 MG tablet Take 50 mg by mouth daily.      valACYclovir (VALTREX) 500 MG tablet Take 500 mg by mouth 2 (two) times daily.       Review of Systems  Pertinent pos/neg as indicated in HPI  Blood pressure (!) 138/109, pulse 90, resp. rate 16. General appearance: alert, cooperative, and no distress Lungs: clear to auscultation bilaterally Heart: regular rate and rhythm Abdomen: gravid, soft, non-tender Extremities: tr edema   Fetal monitoring: FHR: 150 bpm, variability: moderate,  Accelerations: Present,  decelerations:  Absent Uterine activity: irritability Dilation: 5 Effacement (%): 80 Station: -2 Exam by:: Yassen Kinnett,cnm, membranes swept Presentation: cephalic   Prenatal labs: ABO, Rh: A/RH(D) POSITIVE/-- (05/25 1027) Antibody: Negative (10/03 0907) Rubella: 2.17 (05/25 1027) RPR: Non Reactive (10/03 0907)  HBsAg: NON-REACTIVE (05/25 1027)  HIV: Non Reactive (10/03 0907)  GBS: Positive/-- (11/23 1450)  2hr GTT: normal  No results found for this or any previous visit (from the past 24 hour(s)).   Assessment:  [redacted]w[redacted]d SIUP  G3P2002  CHTN  Cat 1 FHR  GBS Positive/-- (11/23 1450)  Plan:  Admit to L&D  IV pain meds/epidural prn active labor  Pitocin per protocol  Anticipate NSVB   Plans to breast & bottlefeed  Contraception: depo  Circumcision: yes  Cheral Marker CNM, WHNP-BC 10/22/2021, 8:02 AM

## 2021-10-22 NOTE — Progress Notes (Signed)
Brooke Briggs MRN: [id number]  Subjective: -Patient resting in bed.  Reports some discomfort with sitting in bed, but coping well with contractions.    Objective: BP 117/75    Pulse 79    Temp 98.2 F (36.8 C) (Oral)    Resp 16    Ht 5\' 4"  (1.626 m)    Wt 76.7 kg    BMI 29.01 kg/m  No intake/output data recorded. No intake/output data recorded.  Fetal Monitoring: FHT: 145 bpm, Mod Var, -Decels, +10x10 Accels UC: Q2-56min, palpates mild    Vaginal Exam: SVE:   Dilation: 5.5 Effacement (%): 80 Station: -3 Exam by:: Aslin Farinas,cnm Membranes:AROM with small amt clear fluid Internal Monitors: None  Augmentation/Induction: Pitocin:38mUn/min Cytotec: None  Assessment:  IUP at 39 weeks Cat I FT  IOL s/t CHTN BP Normotensive Amniotomy GBS Positive  Plan: -Discussed AROM r/b including increased risk of infection, cord prolapse, fetal intolerance, and decreased labor time. No questions or concerns and patient desires to proceed with AROM.  -Informed that labor process should start to progress. -2nd Dose PCN due now -Encouraged movement as tolerated. -Continue other mgmt as ordered. -Anticipate SVD   9m, CNM Advanced Practice Provider, Center for [facility] 10/22/2021, 12:12 PM

## 2021-10-23 ENCOUNTER — Other Ambulatory Visit (HOSPITAL_COMMUNITY): Payer: Self-pay

## 2021-10-23 MED ORDER — ACETAMINOPHEN 500 MG PO TABS
1000.0000 mg | ORAL_TABLET | Freq: Three times a day (TID) | ORAL | 0 refills | Status: DC | PRN
  Filled 2021-10-23: qty 60, 10d supply, fill #0

## 2021-10-23 MED ORDER — IBUPROFEN 600 MG PO TABS
600.0000 mg | ORAL_TABLET | Freq: Four times a day (QID) | ORAL | 0 refills | Status: DC | PRN
  Filled 2021-10-23: qty 40, 10d supply, fill #0

## 2021-10-23 MED ORDER — SERTRALINE HCL 50 MG PO TABS
50.0000 mg | ORAL_TABLET | Freq: Every day | ORAL | 0 refills | Status: DC
Start: 2021-10-23 — End: 2023-11-08
  Filled 2021-10-23: qty 30, 30d supply, fill #0

## 2021-10-23 MED ORDER — FUROSEMIDE 20 MG PO TABS
20.0000 mg | ORAL_TABLET | Freq: Every day | ORAL | 0 refills | Status: DC
  Filled 2021-10-23: qty 5, 5d supply, fill #0

## 2021-10-23 MED ORDER — NIFEDIPINE ER 30 MG PO TB24
30.0000 mg | ORAL_TABLET | Freq: Every day | ORAL | 0 refills | Status: DC
  Filled 2021-10-23: qty 30, 30d supply, fill #0

## 2021-10-23 MED ORDER — FUROSEMIDE 20 MG PO TABS
20.0000 mg | ORAL_TABLET | Freq: Two times a day (BID) | ORAL | Status: DC
  Administered 2021-10-23: 20 mg via ORAL
  Filled 2021-10-23: qty 1

## 2021-10-23 NOTE — Lactation Note (Signed)
This note was copied from a baby's chart. Lactation Consultation Note  Patient Name: Brooke Briggs GURKY'H Date: 10/23/2021 Reason for consult: Initial assessment Age:30 hours  Mother requested a nipple shield.  She has used them in the past.  Fitted mother with 20 mm nipple shield.  Recommend if mother is using NS on a regular basis she needs to be pumping. Reviewed engorgement care and monitoring voids/stools.   Feeding Mother's Current Feeding Choice: Breast Milk   Lactation Tools Discussed/Used Tools: Nipple Shields Nipple shield size: 20  Interventions Interventions: Education  Discharge Discharge Education: Engorgement and breast care;Warning signs for feeding baby  Consult Status Consult Status: Complete    Hardie Pulley 10/23/2021, 2:42 PM

## 2021-10-23 NOTE — Progress Notes (Signed)
Provided information on how to set up babyscripts app on patient's phone.Order placed. Provided blood pressure cuff to patient. Patient is unable to set up app on her phone at this time due to her phone battery being dead and her significant other may not be able to come into the hospital to pick her up and bring her phone charger due to having her other children with her. Called Dr. Geryl Rankins who stated to go ahead and give patient the information on how to set up app and give pt the blood pressure cuff. Instruct patient to have follow up blood pressure check in office in one week and to call office if her blood pressure is >140/90. Earl Gala, [facility] [place]

## 2021-10-23 NOTE — Progress Notes (Shared)
POSTPARTUM PROGRESS NOTE  Post Partum Day 1  Subjective:  Brooke Briggs is a 30 y.o. P6P9509 s/p VD at [redacted]w[redacted]d.  She reports she is doing well. No acute events overnight. She denies any problems with ambulating, voiding or po intake. Denies nausea or vomiting.  Pain is well controlled.  Lochia is small and improving. Flu A positive but notes she feels very well from flu perspective.  Objective: Blood pressure 110/73, pulse 74, temperature 97.6 F (36.4 C), temperature source Oral, resp. rate 18, height 5\' 4"  (1.626 m), weight 76.7 kg, SpO2 97 %, unknown if currently breastfeeding.  Physical Exam:  General: alert, cooperative and no distress Chest: no respiratory distress Heart:regular rate, distal pulses intact Abdomen: soft, nontender,  Uterine Fundus: firm, appropriately tender DVT Evaluation: No calf swelling or tenderness Extremities: Trace edema Skin: warm, dry  Recent Labs    10/22/21 0726  HGB 10.6*  HCT 32.6*    Assessment/Plan: Brooke Briggs is a 30 y.o. 26 s/p VD at [redacted]w[redacted]d   PPD#1 - Doing well  Routine postpartum care  #cHTN: BP has not been severe range but has had elevated readings intermittently, ranging 110-151/73-95 since delivery. On Procardia 30mg . Will add Lasix today. #Contraception: Depo shot  #Feeding: breast and formula #Dispo: Plan for discharge later today.   LOS: 1 day   Brooke Briggs 10/23/2021, 7:44 AM

## 2021-10-23 NOTE — Social Work (Signed)
MOB was referred for history of depression/anxiety/PTSD.  * Referral screened out by Clinical Social Worker because none of the following criteria appear to apply: ~ History of anxiety/depression during this pregnancy, or of post-partum depression following prior delivery. ~ Diagnosis of anxiety and/or depression within last 3 years OR * MOB's symptoms currently being treated with medication and/or therapy. Per chart review, MOB takes Buspar, Zoloft and sees a therapist.   Please contact the Clinical Social Worker if needs arise, by [facility] request, or if MOB scores greater than 9/yes to question 10 on Edinburgh Postpartum Depression Screen.   Vivi Barrack, MSW, LCSW [facility]  Clinical Social Worker  [phone] 12-09-20  8:46 AM

## 2021-10-23 NOTE — Progress Notes (Signed)
Called Dr. Olevia Perches regarding patients increased BP's, provider is aware, and only want's to be called for severe range pressures 160/110.

## 2021-10-23 NOTE — Discharge Summary (Signed)
Postpartum Discharge Summary     Patient Name: Brooke Briggs DOB: 06/22/1991 MRN: [id number]  Date of admission: 10/22/2021 Delivery date:10/22/2021  Delivering provider: Gavin Pound  Date of discharge: 10/23/2021  Admitting diagnosis: Chronic hypertension [I10] Intrauterine pregnancy: [redacted]w[redacted]d    Secondary diagnosis:  Principal Problem:   Spontaneous vaginal delivery Active Problems:   Recurrent genital HSV (herpes simplex virus) infection   GBS (group B streptococcus) infection   Chronic hypertension  Additional problems: None     Discharge diagnosis: Term Pregnancy Delivered                                              Post partum procedures: None Augmentation: AROM and Pitocin Complications: None  Hospital course: Induction of Labor With Vaginal Delivery   30y.o. yo [id number] admitted to the hospital 10/22/2021 for induction of labor.  Indication for induction:  Chronic HTN .  Patient had an uncomplicated labor course as follows: Membrane Rupture Time/Date: 12:05 PM ,10/22/2021   Delivery Method:Vaginal, Spontaneous  Episiotomy: None  Lacerations:  None  Details of delivery can be found in separate delivery note.  Patient had a routine postpartum course and is meeting all milestones. She was started on Procardia 30 mg for BP control postpartum with good result. She will continue Lasix for 5 days after delivery and will have a BP check in office in one week. Babyscripts and medications to bedside ordered. All medications reviewed with patient, in addition to signs/symptoms warranting re-evaluation. Patient voiced understanding and all questions answered. Patient is discharged home 10/23/21.  Newborn Data: Birth date:10/22/2021  Birth time:1:23 PM  Gender:Female  Living status:Living  Apgars:9 ,9  Weight:3232 g   Magnesium Sulfate received: No BMZ received: No Rhophylac: N/A MMR: N/A T-DaP: Given prenatally Flu: Given prenatally  Transfusion:  No  Physical exam  Vitals:   10/22/21 1947 10/22/21 2330 10/23/21 0037 10/23/21 0439  BP: (!) 130/92 133/87 (!) 132/95 110/73  Pulse: 66 73 71 74  Resp: _0 Temp: 98.1 F (36.7 C) 98 F (36.7 C)  97.6 F (36.4 C)  TempSrc: Oral Oral  Oral  SpO2: 100% 99%  97%  Weight:      Height:       General: alert, cooperative, and no distress Lochia: appropriate Uterine Fundus: firm and below umbilicus  DVT Evaluation: no LE edema or calf tenderness to palpation  Labs: Lab Results  Component Value Date   WBC 12.6 (H) 10/22/2021   HGB 10.6 (L) 10/22/2021   HCT 32.6 (L) 10/22/2021   MCV 87.4 10/22/2021   PLT 311 10/22/2021   No flowsheet data found. Edinburgh Score: Edinburgh Postnatal Depression Scale Screening Tool 10/23/2021  I have been able to laugh and see the funny side of things. 0  I have looked forward with enjoyment to things. 0  I have blamed myself unnecessarily when things went wrong. 0  I have been anxious or worried for no good reason. 0  I have felt scared or panicky for no good reason. 0  Things have been getting on top of me. 1  I have been so unhappy that I have had difficulty sleeping. 0  I have felt sad or miserable. 0  I have been so unhappy that I have been crying. 0  The thought of harming myself  has occurred to me. 0  Edinburgh Postnatal Depression Scale Total 1     After visit meds:  Allergies as of 10/23/2021       Reactions   Dilaudid [hydromorphone Hcl] Nausea And Vomiting   Hydromorphone Other (See Comments)        Medication List     STOP taking these medications    aspirin EC 81 MG tablet   labetalol 200 MG tablet Commonly known as: NORMODYNE   valACYclovir 500 MG tablet Commonly known as: VALTREX       TAKE these medications    acetaminophen 500 MG tablet Commonly known as: TYLENOL Take 2 tablets (1,000 mg total) by mouth every 8 (eight) hours as needed (pain).   Blood Pressure Monitor Automat Devi Take BP at  home daily.  Alert [place] if >140/90 more than once.   busPIRone 10 MG tablet Commonly known as: BUSPAR Take by mouth.   ferrous sulfate 325 (65 FE) MG tablet Take 1 tablet (325 mg total) by mouth every other day.   furosemide 20 MG tablet Commonly known as: LASIX Take 1 tablet (20 mg total) by mouth daily for 5 days.   ibuprofen 600 MG tablet Commonly known as: ADVIL Take 1 tablet (600 mg total) by mouth every 6 (six) hours as needed (pain).   NIFEdipine 30 MG 24 hr tablet Commonly known as: ADALAT CC Take 1 tablet (30 mg total) by mouth daily.   PRENATAL VITAMIN PO Take by mouth.   sertraline 50 MG tablet Commonly known as: ZOLOFT Take 1 tablet (50 mg total) by mouth daily.         Discharge home in stable condition Infant Feeding: Bottle and Breast Infant Disposition: home with mother Discharge instruction: per After Visit Summary and Postpartum booklet. Activity: Advance as tolerated. Pelvic rest for 6 weeks.  Diet: routine diet  Follow up Visit: Message sent to [facility] by Dr. Gwenlyn Perking on 12/15.  Please schedule this patient for a In person postpartum visit in  4-6 weeks  with the following provider: Any provider. Additional Postpartum F/U: BP check 1 week  High risk pregnancy complicated by: HTN Delivery mode:  Vaginal, Spontaneous  Anticipated Birth Control:  Depo  10/23/2021 Genia Del, MD

## 2021-10-27 ENCOUNTER — Encounter: Payer: Self-pay | Admitting: *Deleted

## 2021-11-05 ENCOUNTER — Telehealth (HOSPITAL_COMMUNITY): Payer: Self-pay | Admitting: *Deleted

## 2021-11-05 NOTE — Telephone Encounter (Signed)
Attempted Hospital Discharge Follow-Up Call.  Left voice mail requesting that patient return RN's phone call.  

## 2021-12-04 ENCOUNTER — Ambulatory Visit: Payer: Medicaid Other | Admitting: Obstetrics & Gynecology

## 2022-01-10 IMAGING — US US MFM OB DETAIL+14 WK
1 series · 13 of 28 positions shown · non-contrast
Comparison: none

[Series 1: us mfm ob detail+14 wk · 13 of 93 slices shown]
[im 4/93]
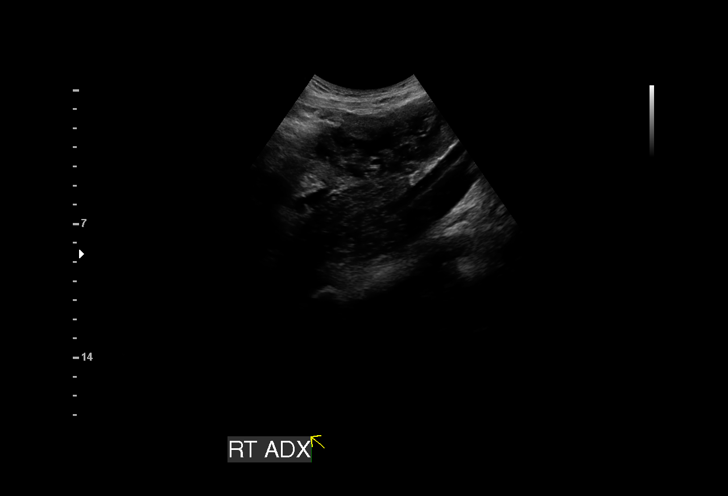
[im 11/93]
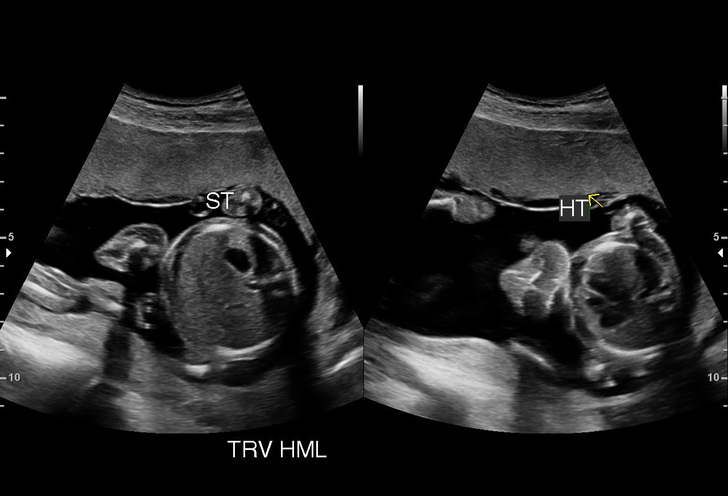
[im 18/93]
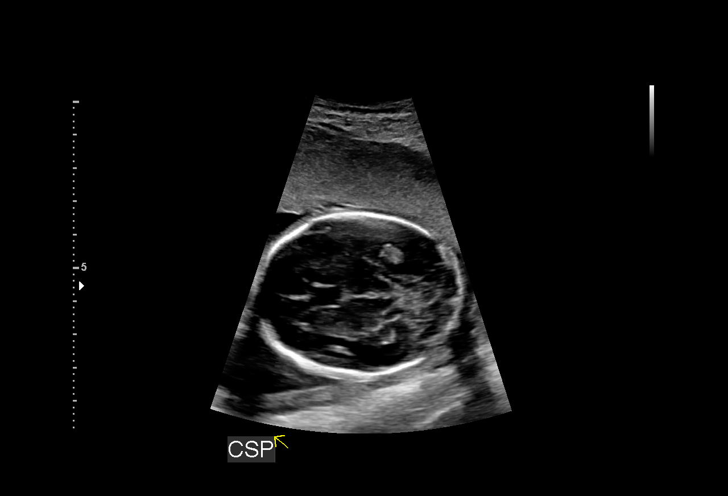
[im 24/93]
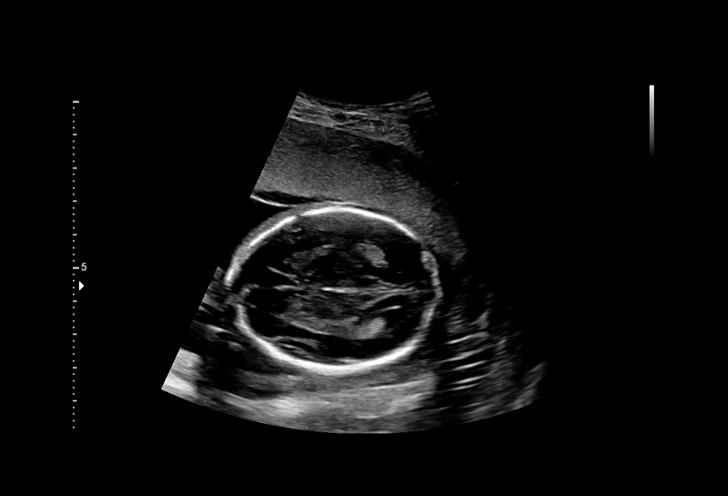
[im 31/93]
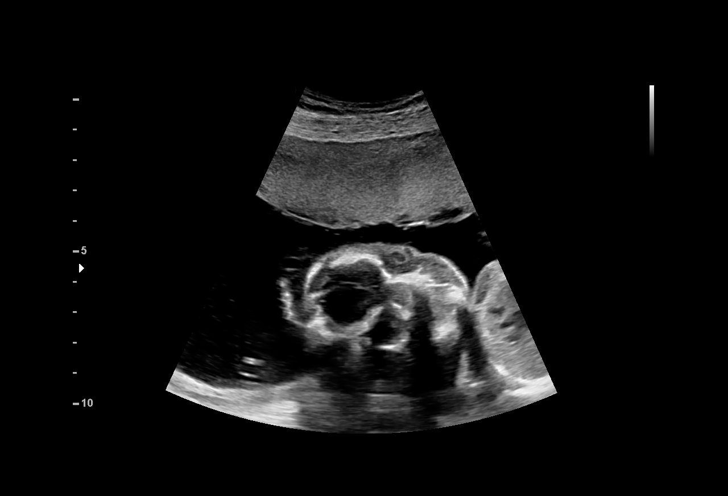
[im 38/93]
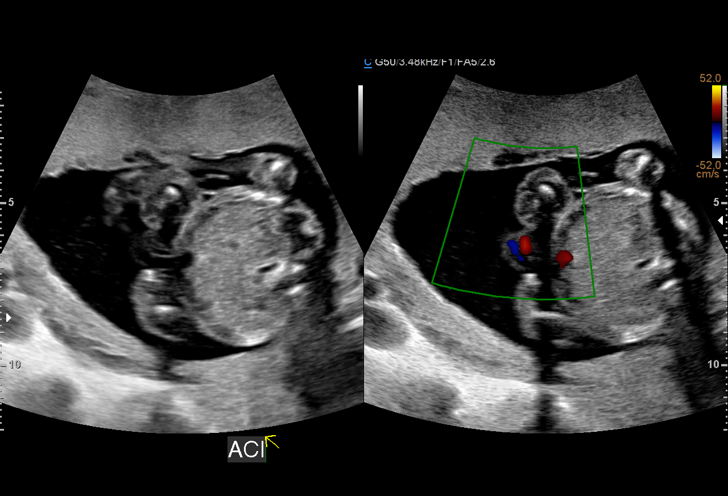
[im 48/93]
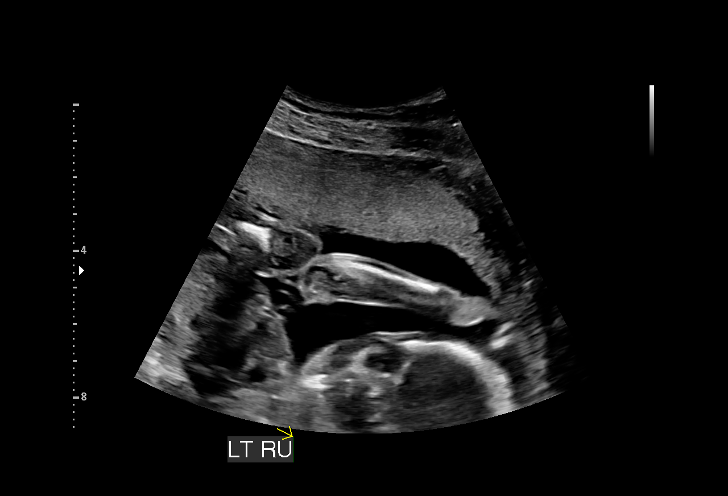
[im 55/93]
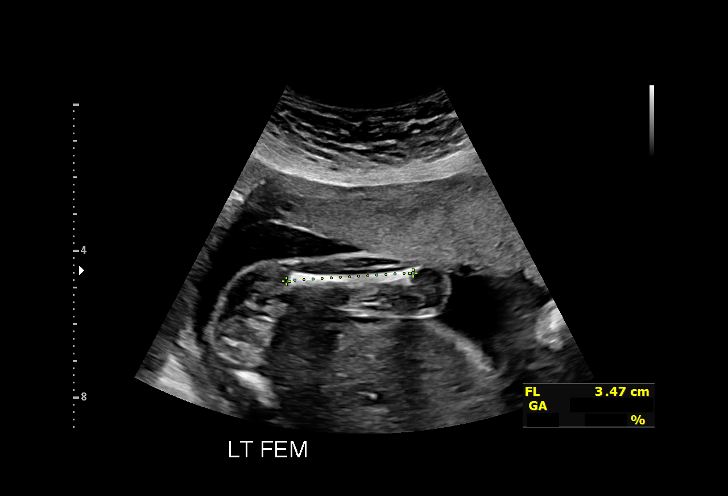
[im 62/93]
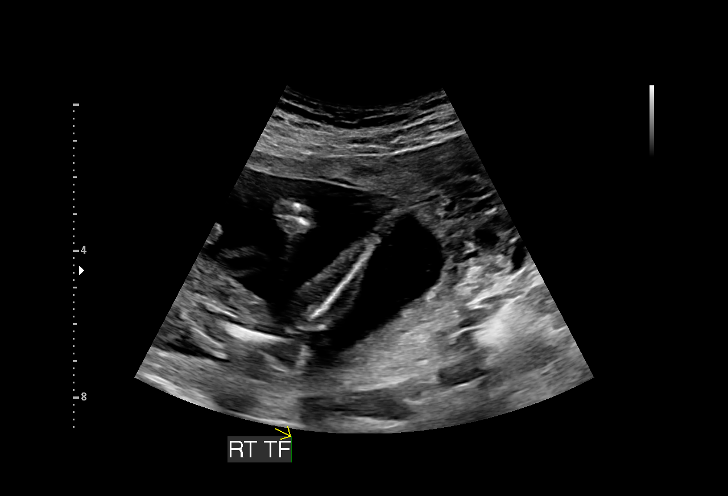
[im 69/93]
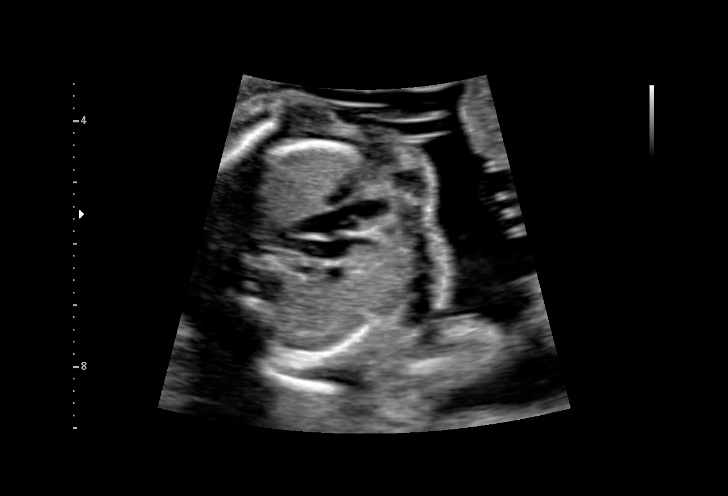
[im 75/93]
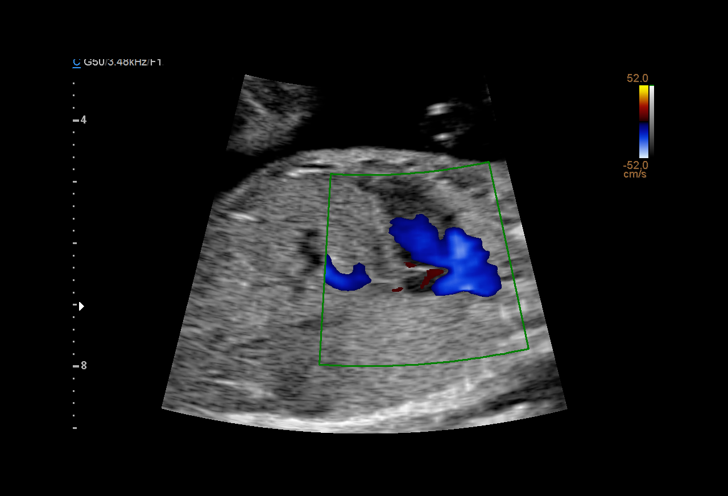
[im 82/93]
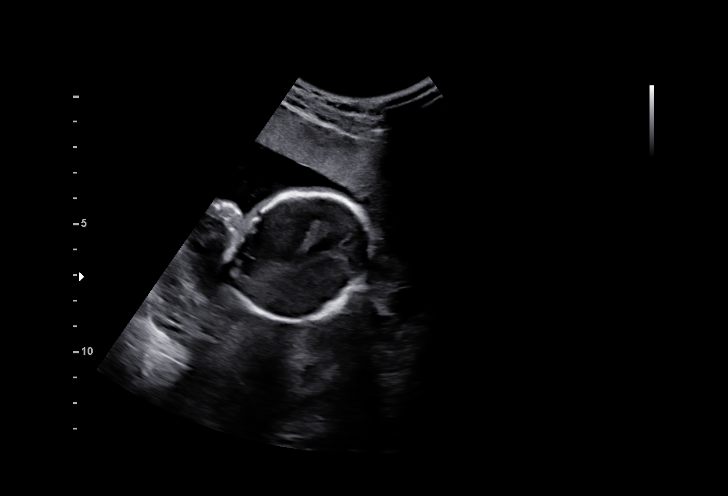
[im 89/93]
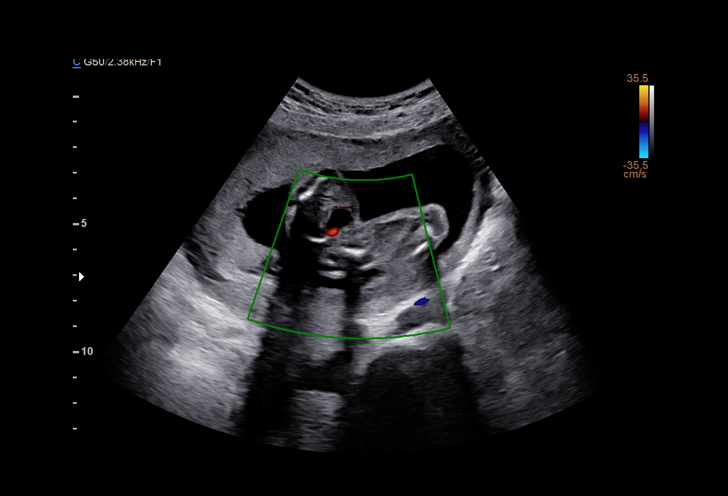

[13 of 28 positions shown; findings below may reference images not displayed]

STAMM NP

Indications

 Substance abuse affecting pregnancy,
 antepartum
 Hypertension - Chronic/Pre-existing
 Other mental disorder complicating
 pregnancy, unspecified trimester
 (depression)
 20 weeks gestation of pregnancy
 Neg Horizon
 LR Male
Fetal Evaluation

 Num Of Fetuses:          1
 Preg. Location:          Intrauterine
 Fetal Heart Rate(bpm):   140
 Cardiac Activity:        Observed
 Presentation:            Variable
 Placenta:                Anterior
 P. Cord Insertion:       Visualized

 Amniotic Fluid
 AFI FV:      Within normal limits

                             Largest Pocket(cm)

Biometry
 BPD:      48.2  mm     G. Age:  20w 4d         37  %    CI:        69.92   %    70 - 86
                                                         FL/HC:       18.8  %    15.9 -
 HC:      183.9  mm     G. Age:  20w 5d         36  %    HC/AC:       1.14       1.06 -
 AC:      161.4  mm     G. Age:  21w 2d         56  %    FL/BPD:      71.8  %
 FL:       34.6  mm     G. Age:  20w 6d         42  %    FL/AC:       21.4  %    20 - 24
 HUM:      33.7  mm     G. Age:  21w 3d         63  %
 CER:      23.1  mm     G. Age:  21w 3d         82  %
 NFT:       4.1  mm

 LV:        6.3  mm
 CM:        4.8  mm

 Est. FW:     394   gm   0 lb 14 oz      54  %
Gestational Age

 U/S Today:     20w 6d                                        EDD:   10/29/21
 Best:          20w 6d     Det. By:  U/S (06/17/21)           EDD:   10/29/21
Anatomy

 Cranium:               Appears normal         LVOT:                   Appears normal
 Cavum:                 Appears normal         Aortic Arch:            Not well visualized
 Ventricles:            Appears normal         Ductal Arch:            Appears normal
 Choroid Plexus:        Appears normal         Diaphragm:              Appears normal
 Cerebellum:            Appears normal         Stomach:                Appears normal, left
                                                                       sided
 Posterior Fossa:       Appears normal         Abdomen:                Appears normal
 Nuchal Fold:           Appears normal         Abdominal Wall:         Appears nml (cord
                                                                       insert, abd wall)
 Face:                  Appears normal         Cord Vessels:           Appears normal (3
                        (orbits and profile)                           vessel cord)
 Lips:                  Appears normal         Kidneys:                Appear normal
 Palate:                Limited Views          Bladder:                Appears normal
 Thoracic:              Appears normal         Spine:                  Ltd views no
                                                                       intracranial signs of
                                                                       NTD
 Heart:                 Appears normal         Upper Extremities:      Visualized
                        (4CH, axis, and
                        situs)
 RVOT:                  Appears normal         Lower Extremities:      Visualized

 Other:  Fetus appears to be a male. BCV, 3VV and 3VTV visualized.
Cervix Uterus Adnexa

 Cervix
 Length:           4.64  cm.
 Normal appearance by transabdominal scan.

 Uterus
 No abnormality visualized.

 Right Ovary
 Not visualized.

 Left Ovary
 Not visualized.
 Cul De Sac
 No free fluid seen.

 Adnexa
 No abnormality visualized.
Comments

 This patient was seen for a detailed fetal anatomy scan due
 to maternal substance use.
 She denies any significant past medical history and denies
 any problems in her current pregnancy.
 She had a cell free DNA test earlier in her pregnancy which
 indicated a low risk for trisomy 21, 18, and 13. A male fetus is
 predicted.
 As there was a greater than 10-day difference in the EDC
 based on based on her LMP and the EDC based on today's
 exam, her EDC was changed to October 29, 2021, making
 her 20 weeks and 6 days pregnant today.
 There were no obvious fetal anomalies noted on today's
 ultrasound exam.  However, the views of the fetal anatomy
 were limited today due to the fetal position.
 The patient was informed that anomalies may be missed due
 to technical limitations. If the fetus is in a suboptimal position
 or maternal habitus is increased, visualization of the fetus in
 the maternal uterus may be impaired.
 A follow-up exam was scheduled in 4 weeks to complete the
 views of the fetal anatomy and to confirm her dates.

## 2022-01-22 ENCOUNTER — Ambulatory Visit: Payer: Medicaid Other | Admitting: Women's Health

## 2022-06-18 ENCOUNTER — Ambulatory Visit: Payer: Medicaid Other | Admitting: Adult Health

## 2022-06-18 ENCOUNTER — Encounter: Payer: Self-pay | Admitting: *Deleted

## 2023-01-11 ENCOUNTER — Other Ambulatory Visit: Payer: Self-pay | Admitting: Advanced Practice Midwife

## 2023-11-08 ENCOUNTER — Ambulatory Visit: Payer: Medicaid Other | Admitting: *Deleted

## 2023-11-08 ENCOUNTER — Other Ambulatory Visit: Payer: Self-pay | Admitting: Obstetrics & Gynecology

## 2023-11-08 VITALS — BP 168/93 | HR 75

## 2023-11-08 DIAGNOSIS — O3680X0 Pregnancy with inconclusive fetal viability, not applicable or unspecified: Secondary | ICD-10-CM

## 2023-11-08 DIAGNOSIS — Z3201 Encounter for pregnancy test, result positive: Secondary | ICD-10-CM

## 2023-11-08 LAB — POCT URINE PREGNANCY: Preg Test, Ur: POSITIVE — AB

## 2023-11-08 NOTE — Progress Notes (Signed)
   NURSE VISIT- PREGNANCY CONFIRMATION   SUBJECTIVE:  Brooke Briggs is a 32 y.o. [phone] female at 103w3d by certain LMP of Patient's last menstrual period was 09/03/2023. Here for pregnancy confirmation.  Home pregnancy test: positive x 3   She reports no complaints.  She is taking prenatal vitamins.    OBJECTIVE:  BP (!) 168/93 (BP Location: Right Arm, Patient Position: Sitting, Cuff Size: Large)   Pulse 75   LMP 09/03/2023   Breastfeeding No   Appears well, in no apparent distress  Results for orders placed or performed in visit on 11/08/23 (from the past 24 hours)  POCT urine pregnancy   Collection Time: 11/08/23  1:31 PM  Result Value Ref Range   Preg Test, Ur Positive (A) Negative    ASSESSMENT: Positive pregnancy test, [redacted]w[redacted]d by LMP    PLAN: Schedule for dating ultrasound in next available days Prenatal vitamins: continue   Nausea medicines: not currently needed   OB packet given: Yes  Annamarie Dawley  11/08/2023 1:41 PM

## 2023-11-09 ENCOUNTER — Ambulatory Visit (INDEPENDENT_AMBULATORY_CARE_PROVIDER_SITE_OTHER): Payer: Medicaid Other

## 2023-11-09 DIAGNOSIS — O3680X0 Pregnancy with inconclusive fetal viability, not applicable or unspecified: Secondary | ICD-10-CM

## 2023-11-09 DIAGNOSIS — Z3A10 10 weeks gestation of pregnancy: Secondary | ICD-10-CM

## 2023-11-09 NOTE — Progress Notes (Signed)
[place] 5+[redacted] wks gestational sac with yolk sac,no fetal pole visualized,GS 5.1 mm,simple left corpus luteal cyst 3.1 x 3.1 x 2.9 cm,normal right ovary,pt will come back for F/U ultrasound in 10-14 days

## 2023-11-10 NOTE — L&D Delivery Note (Signed)
 OB/GYN Faculty Practice Delivery Note  Brooke Briggs is a 33 y.o. [id number] s/p SVD at [redacted]w[redacted]d. She was admitted for IOL for ROM as well as possible sepsis..   ROM: 15h 63m with meconium fluid GBS Status:  Negative/-- (08/07 1435) Maximum Maternal Temperature:  Temp (24hrs), Avg:100.1 F (37.8 C), Min:97.6 F (36.4 C), Max:103 F (39.4 C)    Labor Progress: Patient arrived at 5.5 cm dilation and was induced with Pitocin .   Delivery Date/Time: 06/20/2024 at 1204 Delivery: Called to room and patient was complete and pushing. Head delivered in LOA position. No nuchal cord present. Shoulder and body delivered in usual fashion. Infant with spontaneous cry, placed on mother's abdomen, dried and stimulated. Cord clamped x 2 after 1-minute delay, and cut by FOB. Cord blood drawn. Placenta delivered spontaneously with gentle cord traction. Fundus firm with massage and Pitocin . Labia, perineum, vagina, and cervix inspected with no lacerations.   Placenta:  spontaneous, intact, 3 vessel cord  Complications: None Lacerations: None EBL: 98 mL Analgesia: None  Infant: APGAR (1 MIN): 6  APGAR (5 MINS): 6  APGAR (10 MINS): 9   Weight: 3030 g  Steffan Rover, MD Attending Family Medicine Physician, [facility] for [facility], [facility]  06/20/2024 1:43 PM

## 2023-11-20 ENCOUNTER — Other Ambulatory Visit: Payer: Self-pay | Admitting: Obstetrics & Gynecology

## 2023-11-20 DIAGNOSIS — O3680X0 Pregnancy with inconclusive fetal viability, not applicable or unspecified: Secondary | ICD-10-CM

## 2023-11-20 DIAGNOSIS — O26841 Uterine size-date discrepancy, first trimester: Secondary | ICD-10-CM

## 2023-11-23 ENCOUNTER — Encounter: Payer: Self-pay | Admitting: Radiology

## 2023-11-23 ENCOUNTER — Ambulatory Visit (INDEPENDENT_AMBULATORY_CARE_PROVIDER_SITE_OTHER): Payer: Medicaid Other | Admitting: Radiology

## 2023-11-23 DIAGNOSIS — O3680X0 Pregnancy with inconclusive fetal viability, not applicable or unspecified: Secondary | ICD-10-CM

## 2023-11-23 DIAGNOSIS — Z3A01 Less than 8 weeks gestation of pregnancy: Secondary | ICD-10-CM

## 2023-11-23 DIAGNOSIS — O26841 Uterine size-date discrepancy, first trimester: Secondary | ICD-10-CM

## 2023-11-23 NOTE — Progress Notes (Signed)
 [place]:  GA by LNMP = 11+4  Anteflexed uterus with single viable early IUP = 6+5 weeks  EDC = 07-13-24 CRL = 8.3 mm,  FHR = 147 bpm   GS intact within mid fundal cavity,   nl YS = 4 mm Normal ov's    neg adnexal regions - neg CDS - no Free fluid

## 2023-12-11 ENCOUNTER — Encounter: Payer: Self-pay | Admitting: Women's Health

## 2023-12-30 ENCOUNTER — Other Ambulatory Visit: Payer: Self-pay | Admitting: Obstetrics & Gynecology

## 2023-12-30 DIAGNOSIS — O0990 Supervision of high risk pregnancy, unspecified, unspecified trimester: Secondary | ICD-10-CM | POA: Insufficient documentation

## 2023-12-30 DIAGNOSIS — Z3682 Encounter for antenatal screening for nuchal translucency: Secondary | ICD-10-CM

## 2024-01-03 ENCOUNTER — Encounter: Payer: Medicaid Other | Admitting: *Deleted

## 2024-01-03 ENCOUNTER — Ambulatory Visit: Payer: Medicaid Other

## 2024-01-03 ENCOUNTER — Ambulatory Visit: Payer: Medicaid Other | Admitting: Women's Health

## 2024-01-03 ENCOUNTER — Encounter: Payer: Self-pay | Admitting: Women's Health

## 2024-01-03 DIAGNOSIS — Z3682 Encounter for antenatal screening for nuchal translucency: Secondary | ICD-10-CM

## 2024-01-03 DIAGNOSIS — Z3A12 12 weeks gestation of pregnancy: Secondary | ICD-10-CM

## 2024-01-03 DIAGNOSIS — O0992 Supervision of high risk pregnancy, unspecified, second trimester: Secondary | ICD-10-CM

## 2024-01-03 DIAGNOSIS — O0991 Supervision of high risk pregnancy, unspecified, first trimester: Secondary | ICD-10-CM

## 2024-01-03 NOTE — Progress Notes (Signed)
 [place] 12+4 wks,measurements c/w dates,FHR 158 bpm,normal ovaries,NB present,NT 1.6 mm,posterior placenta gr 0,CRL 71.37 mm

## 2024-01-03 NOTE — Progress Notes (Unsigned)
 Patient wanting to reschedule due to feeling sick

## 2024-01-04 NOTE — Progress Notes (Signed)
 This encounter was created in error - please disregard. Pt not seen by provider

## 2024-01-10 ENCOUNTER — Ambulatory Visit: Payer: Medicaid Other | Admitting: Advanced Practice Midwife

## 2024-01-10 ENCOUNTER — Encounter: Payer: Medicaid Other | Admitting: *Deleted

## 2024-01-10 ENCOUNTER — Encounter: Payer: Self-pay | Admitting: Advanced Practice Midwife

## 2024-01-10 VITALS — BP 153/103 | HR 103 | Wt 146.0 lb

## 2024-01-10 DIAGNOSIS — Z3A13 13 weeks gestation of pregnancy: Secondary | ICD-10-CM

## 2024-01-10 DIAGNOSIS — Z131 Encounter for screening for diabetes mellitus: Secondary | ICD-10-CM | POA: Diagnosis not present

## 2024-01-10 DIAGNOSIS — F331 Major depressive disorder, recurrent, moderate: Secondary | ICD-10-CM

## 2024-01-10 DIAGNOSIS — A6000 Herpesviral infection of urogenital system, unspecified: Secondary | ICD-10-CM

## 2024-01-10 DIAGNOSIS — I10 Essential (primary) hypertension: Secondary | ICD-10-CM | POA: Diagnosis not present

## 2024-01-10 DIAGNOSIS — Z363 Encounter for antenatal screening for malformations: Secondary | ICD-10-CM

## 2024-01-10 DIAGNOSIS — O0991 Supervision of high risk pregnancy, unspecified, first trimester: Secondary | ICD-10-CM

## 2024-01-10 MED ORDER — LABETALOL HCL 100 MG PO TABS: 100.0000 mg | ORAL_TABLET | Freq: Two times a day (BID) | ORAL | 6 refills | Status: DC

## 2024-01-10 MED ORDER — VALACYCLOVIR HCL 500 MG PO TABS: 500.0000 mg | ORAL_TABLET | Freq: Two times a day (BID) | ORAL | 6 refills | Status: DC

## 2024-01-10 MED ORDER — ASPIRIN 81 MG PO TBEC: 162.0000 mg | DELAYED_RELEASE_TABLET | Freq: Every day | ORAL | 6 refills | Status: DC

## 2024-01-10 NOTE — Patient Instructions (Addendum)
 Ardean Larsen Gullickson, I greatly value your feedback.  If you receive a survey following your visit with [place] today, we appreciate you taking the time to fill it out.  Thanks, Cathie Beams, DNP, CNM  [facility] HAS MOVED!!! It is now [facility] ([address] [place], [place] [address]) Entrance located off of [address] Free 24/7 valet parking   Nausea & Vomiting Have saltine crackers or pretzels by your bed and eat a few bites before you raise your head out of bed in the morning Eat small frequent meals throughout the day instead of large meals Drink plenty of fluids throughout the day to stay hydrated, just don't drink a lot of fluids with your meals.  This can make your stomach fill up faster making you feel sick Do not brush your teeth right after you eat Products with real ginger are good for nausea, like ginger ale and ginger hard candy Make sure it says made with real ginger! Sucking on sour candy like lemon heads is also good for nausea If your prenatal vitamins make you nauseated, take them at night so you will sleep through the nausea Sea Bands If you feel like you need medicine for the nausea & vomiting please let us know If you are unable to keep any fluids or food down please let us know   Constipation Drink plenty of fluid, preferably water, throughout the day Eat foods high in fiber such as fruits, vegetables, and grains Exercise, such as walking, is a good way to keep your bowels regular Drink warm fluids, especially warm prune juice, or decaf coffee Eat a 1/2 cup of real oatmeal (not instant), 1/2 cup applesauce, and 1/2-1 cup warm prune juice every day If needed, you may take Colace (docusate sodium) stool softener once or twice a day to help keep the stool soft.  If you still are having problems with constipation, you may take Miralax once daily as needed to help keep your bowels regular.   Home Blood Pressure Monitoring for  Patients   Your provider has recommended that you check your blood pressure (BP) at least once a week at home. If you do not have a blood pressure cuff at home, one will be provided for you. Contact your provider if you have not received your monitor within 1 week.   Helpful Tips for Accurate Home Blood Pressure Checks  Don't smoke, exercise, or drink caffeine 30 minutes before checking your BP Use the restroom before checking your BP (a full bladder can raise your pressure) Relax in a comfortable upright chair Feet on the ground Left arm resting comfortably on a flat surface at the level of your heart Legs uncrossed Back supported Sit quietly and don't talk Place the cuff on your bare arm Adjust snuggly, so that only two fingertips can fit between your skin and the top of the cuff Check 2 readings separated by at least one minute Keep a log of your BP readings For a visual, please reference this diagram: [email or web address].care/bpdiagram  Provider Name: [facility]     Phone: [phone]  Zone 1: ALL CLEAR  Continue to monitor your symptoms:  BP reading is less than 140 (top number) or less than 90 (bottom number)  No right upper stomach pain No headaches or seeing spots No feeling nauseated or throwing up No swelling in face and hands  Zone 2: CAUTION Call your doctor's office for any of the following:  BP  reading is greater than 140 (top number) or greater than 90 (bottom number)  Stomach pain under your ribs in the middle or right side Headaches or seeing spots Feeling nauseated or throwing up Swelling in face and hands  Zone 3: EMERGENCY  Seek immediate medical care if you have any of the following:  BP reading is greater than160 (top number) or greater than 110 (bottom number) Severe headaches not improving with Tylenol Serious difficulty catching your breath Any worsening symptoms from Zone 2    First Trimester of Pregnancy The first trimester of pregnancy is from  week 1 until the end of week 12 (months 1 through 3). A week after a sperm fertilizes an egg, the egg will implant on the wall of the uterus. This embryo will begin to develop into a baby. Genes from you and your partner are forming the baby. The female genes determine whether the baby is a boy or a girl. At 6-8 weeks, the eyes and face are formed, and the heartbeat can be seen on ultrasound. At the end of 12 weeks, all the baby's organs are formed.  Now that you are pregnant, you will want to do everything you can to have a healthy baby. Two of the most important things are to get good prenatal care and to follow your health care provider's instructions. Prenatal care is all the medical care you receive before the baby's birth. This care will help prevent, find, and treat any problems during the pregnancy and childbirth. BODY CHANGES Your body goes through many changes during pregnancy. The changes vary from woman to woman.  You may gain or lose a couple of pounds at first. You may feel sick to your stomach (nauseous) and throw up (vomit). If the vomiting is uncontrollable, call your health care provider. You may tire easily. You may develop headaches that can be relieved by medicines approved by your health care provider. You may urinate more often. Painful urination may mean you have a bladder infection. You may develop heartburn as a result of your pregnancy. You may develop constipation because certain hormones are causing the muscles that push waste through your intestines to slow down. You may develop hemorrhoids or swollen, bulging veins (varicose veins). Your breasts may begin to grow larger and become tender. Your nipples may stick out more, and the tissue that surrounds them (areola) may become darker. Your gums may bleed and may be sensitive to brushing and flossing. Dark spots or blotches (chloasma, mask of pregnancy) may develop on your face. This will likely fade after the baby is  born. Your menstrual periods will stop. You may have a loss of appetite. You may develop cravings for certain kinds of food. You may have changes in your emotions from day to day, such as being excited to be pregnant or being concerned that something may go wrong with the pregnancy and baby. You may have more vivid and strange dreams. You may have changes in your hair. These can include thickening of your hair, rapid growth, and changes in texture. Some women also have hair loss during or after pregnancy, or hair that feels dry or thin. Your hair will most likely return to normal after your baby is born. WHAT TO EXPECT AT YOUR PRENATAL VISITS During a routine prenatal visit: You will be weighed to make sure you and the baby are growing normally. Your blood pressure will be taken. Your abdomen will be measured to track your baby's growth. The fetal heartbeat  will be listened to starting around week 10 or 12 of your pregnancy. Test results from any previous visits will be discussed. Your health care provider may ask you: How you are feeling. If you are feeling the baby move. If you have had any abnormal symptoms, such as leaking fluid, bleeding, severe headaches, or abdominal cramping. If you have any questions. Other tests that may be performed during your first trimester include: Blood tests to find your blood type and to check for the presence of any previous infections. They will also be used to check for low iron levels (anemia) and Rh antibodies. Later in the pregnancy, blood tests for diabetes will be done along with other tests if problems develop. Urine tests to check for infections, diabetes, or protein in the urine. An ultrasound to confirm the proper growth and development of the baby. An amniocentesis to check for possible genetic problems. Fetal screens for spina bifida and Down syndrome. You may need other tests to make sure you and the baby are doing well. HOME CARE  INSTRUCTIONS  Medicines Follow your health care provider's instructions regarding medicine use. Specific medicines may be either safe or unsafe to take during pregnancy. Take your prenatal vitamins as directed. If you develop constipation, try taking a stool softener if your health care provider approves. Diet Eat regular, well-balanced meals. Choose a variety of foods, such as meat or vegetable-based protein, fish, milk and low-fat dairy products, vegetables, fruits, and whole grain breads and cereals. Your health care provider will help you determine the amount of weight gain that is right for you. Avoid raw meat and uncooked cheese. These carry germs that can cause birth defects in the baby. Eating four or five small meals rather than three large meals a day may help relieve nausea and vomiting. If you start to feel nauseous, eating a few soda crackers can be helpful. Drinking liquids between meals instead of during meals also seems to help nausea and vomiting. If you develop constipation, eat more high-fiber foods, such as fresh vegetables or fruit and whole grains. Drink enough fluids to keep your urine clear or pale yellow. Activity and Exercise Exercise only as directed by your health care provider. Exercising will help you: Control your weight. Stay in shape. Be prepared for labor and delivery. Experiencing pain or cramping in the lower abdomen or low back is a good sign that you should stop exercising. Check with your health care provider before continuing normal exercises. Try to avoid standing for long periods of time. Move your legs often if you must stand in one place for a long time. Avoid heavy lifting. Wear low-heeled shoes, and practice good posture. You may continue to have sex unless your health care provider directs you otherwise. Relief of Pain or Discomfort Wear a good support bra for breast tenderness.   Take warm sitz baths to soothe any pain or discomfort caused by  hemorrhoids. Use hemorrhoid cream if your health care provider approves.   Rest with your legs elevated if you have leg cramps or low back pain. If you develop varicose veins in your legs, wear support hose. Elevate your feet for 15 minutes, 3-4 times a day. Limit salt in your diet. Prenatal Care Schedule your prenatal visits by the twelfth week of pregnancy. They are usually scheduled monthly at first, then more often in the last 2 months before delivery. Write down your questions. Take them to your prenatal visits. Keep all your prenatal visits as directed by  your health care provider. Safety Wear your seat belt at all times when driving. Make a list of emergency phone numbers, including numbers for family, friends, the hospital, and police and fire departments. General Tips Ask your health care provider for a referral to a local prenatal education class. Begin classes no later than at the beginning of month 6 of your pregnancy. Ask for help if you have counseling or nutritional needs during pregnancy. Your health care provider can offer advice or refer you to specialists for help with various needs. Do not use hot tubs, steam rooms, or saunas. Do not douche or use tampons or scented sanitary pads. Do not cross your legs for long periods of time. Avoid cat litter boxes and soil used by cats. These carry germs that can cause birth defects in the baby and possibly loss of the fetus by miscarriage or stillbirth. Avoid all smoking, herbs, alcohol, and medicines not prescribed by your health care provider. Chemicals in these affect the formation and growth of the baby. Schedule a dentist appointment. At home, brush your teeth with a soft toothbrush and be gentle when you floss. SEEK MEDICAL CARE IF:  You have dizziness. You have mild pelvic cramps, pelvic pressure, or nagging pain in the abdominal area. You have persistent nausea, vomiting, or diarrhea. You have a bad smelling vaginal  discharge. You have pain with urination. You notice increased swelling in your face, hands, legs, or ankles. SEEK IMMEDIATE MEDICAL CARE IF:  You have a fever. You are leaking fluid from your vagina. You have spotting or bleeding from your vagina. You have severe abdominal cramping or pain. You have rapid weight gain or loss. You vomit blood or material that looks like coffee grounds. You are exposed to [place] measles and have never had them. You are exposed to fifth disease or chickenpox. You develop a severe headache. You have shortness of breath. You have any kind of trauma, such as from a fall or a car accident. Document Released: 10/20/2001 Document Revised: 03/12/2014 Document Reviewed: 09/05/2013 [facility] Patient Information 2015 [place], [place]. This information is not intended to replace advice given to you by your health care provider. Make sure you discuss any questions you have with your health care provider.  ADDITIONAL HEALTHCARE OPTIONS FOR PATIENTS  [facility] / e-Visit: [email or web address]         [facility]: [phone]  [facility]: [phone]                   [facility]: [phone]     Safe Medications in Pregnancy   Acne: Benzoyl Peroxide Salicylic Acid  Backache/Headache: Tylenol: 2 regular strength every 4 hours OR              2 Extra strength every 6 hours  Colds/Coughs/Allergies: Benadryl (alcohol free) 25 mg every 6 hours as needed Breath right strips Claritin Cepacol throat lozenges Chloraseptic throat spray Cold-Eeze- up to three times per day Cough drops, alcohol free Flonase (by prescription only) Guaifenesin Mucinex Robitussin DM (plain only, alcohol free) Saline nasal spray/drops Sudafed (pseudoephedrine) & Actifed ** use only after [redacted] weeks gestation and if you do not have high blood pressure Tylenol Vicks Vaporub Zinc  lozenges Zyrtec   Constipation: Colace Ducolax suppositories Fleet enema Glycerin suppositories Metamucil Milk of magnesia Miralax Senokot Smooth move tea  Diarrhea: Kaopectate Imodium A-D  *NO pepto Bismol  Hemorrhoids: Anusol Anusol HC Preparation H Tucks  Indigestion: Tums Maalox Mylanta  Zantac  Pepcid  Insomnia: Benadryl (alcohol free) 25mg  every 6 hours as needed Tylenol PM Unisom, no Gelcaps  Leg Cramps: Tums MagGel  Nausea/Vomiting:  Bonine Dramamine Emetrol Ginger extract Sea bands Meclizine  Nausea medication to take during pregnancy:  Unisom (doxylamine succinate 25 mg tablets) Take one tablet daily at bedtime. If symptoms are not adequately controlled, the dose can be increased to a maximum recommended dose of two tablets daily (1/2 tablet in the morning, 1/2 tablet mid-afternoon and one at bedtime). Vitamin B6 100mg  tablets. Take one tablet twice a day (up to 200 mg per day).  Skin Rashes: Aveeno products Benadryl cream or 25mg  every 6 hours as needed Calamine Lotion 1% cortisone cream  Yeast infection: Gyne-lotrimin 7 Monistat 7   **If taking multiple medications, please check labels to avoid duplicating the same active ingredients **take medication as directed on the label ** Do not exceed 4000 mg of tylenol in 24 hours **Do not take medications that contain aspirin or ibuprofen   [phone] is the phone number for Pregnancy Classes or hospital tours at [facility].   You will be referred to  [email or web address]   for more information on childbirth classes   At this site you may register for classes. You may sign up for a waiting list if classes are full. Please SIGN UP FOR THIS!.   When the waiting list becomes long, sometimes new classes can be added.  [facility] Call to Register: [phone] or  [phone]   or   Register Online: [email or web address] THESE CLASSES FILL UP VERY QUICKLY, SO SIGN UP AS SOON AS YOU CAN!!! Please visit [facility] pregnancy website at [email or web address]  Childbirth Classes  Option 1: Birth & Baby Series Series of 3 weekly classes, on the same day of the week (can choose Mon-Thurs) from 6-9pm Helps you and your support person prepare for childbirth Reviews newborn care, labor & birth, cesarean birth, pain management, and comfort techniques Cost: $60 per couple for insured or self-pay, $30 per couple for Medicaid  Option 2: Weekend Birth & Baby This class is a weekend version of our Birth & Baby series.  It is designed for parents who have a difficult time fitting several weeks of classes into their schedule.   Covers the care of your newborn and the basics of labor and childbirth Friday 6:30pm-8:30pm Saturday 9am-4pm, includes lunch for you and your partner  Cost: $75 per couple for insured or self-pay, $30 per couple for Medicaid  Option 3: Natural Childbirth This series of 5 weekly classes is for expectant parents who want to learn and practice natural methods of coping with the process of labor and childbirth.  Can choose Mon or Tues, 7-9pm.   Covers relaxation, breathing, massage, visualization, role of the partner, and helpful positioning Participants learn how to be confident in their body's ability to give birth. Class empowers and helps parents make informed decisions about care. Includes discussion that will help new parents transition into the immediate postpartum period.  Cost: $75 per couple for insured or self-pay, $30 per couple for Medicaid  Option 4: Online Birth & Baby This online class offers you the freedom to complete a Birth & Baby series in the comfort of your own home.  The flexibility of this option allows you to review sections at your own pace, at times convenient to you and your support people.  It includes additional  video information, animations, quizzes and extended activities. Get organized  with helpful eClass tools, checklists, and trackers.  Cost: $60 for 60 days of online access                                                                            Other Available Classes  Baby & Me Enjoy this time to discuss newborn & infant parenting topics and family adjustment issues with other new mothers in a relaxed environment. Each week brings a new speaker or baby-centered activity. We encourage mothers and their babies (birth to crawling) to join [place]. You are welcome to visit this group even if you haven't delivered yet! It's wonderful to make new friends early and watch other moms interact with their babies. No registration or fee.  Big Brother/Big Sister Let your children share in the joy of a new brother or sister in this special class designed just for them. Discussion includes how families care for babies: swaddling, holding, diapering, safety, as well as how they can be helpful in their new role. This class is designed for children ages 2 to 88, but any age is welcome. Please register each child individually. $5 Breastfeeding Support Group This group is a mother-to-mother support circle where moms have the opportunity to share their breastfeeding experiences. A Breastfeeding Support nurse is present for questions and concerns. An infant scale is available for weight checks. No fee or registration.  Breastfeeding Your Baby Breastfeeding is a special time for mother and child. This class will help you feel ready to begin this important relationship. Your partner is encouraged to attend with you. Learn what to expect and feel more confident in the first days of breastfeeding your newborn. This class also addresses the most common fears and challenges of breastfeeding during the first few weeks, months, and beyond. $30 per couple Caring for Baby This class is for expectant and adoptive parents who want to  learn and practice the most up-to-date newborn care for their babies. Focus is on birth through first six weeks of life. Topics include feeding, bathing, diapering, crying, umbilical cord care, circumcision care and safe sleep. Parents learn how to recognize symptoms of illness and when to call the pediatrician. Register only the mom-to-be and your partner can plan to come with you. (*Note: This class is included in the Birth & Baby series and the Weekend Birth & Baby classes.) $10 per couple Comfort Techniques & Tour This 2-hour interactive class is designed for those who either do not wish to take the Birth & Baby series or for those who prefer our online childbirth class, but don't want to miss the opportunity to learn and practice hands-on techniques. These skills can help relieve some of the discomfort of labor and encourage your baby to rotate toward the best position for birth. You and your partner will be able to try a variety of labor positions with birth balls and rebozos as well as practice breathing, relaxation, and visual techniques. $20 per couple [employer] [facility] This course offers Dads-to-be the tools and knowledge needed to feel confident on their journey to becoming new fathers. Experienced dads, who have been trained as coaches, teach dads-to-be how to hold, comfort, diapers, swaddle and play with their infant while being able  to support the new mom as well. $25 Grandparent Love Expecting a grandbaby? Learn about the latest infant care and safety recommendations and ways to support your own child as he or she transitions into the parenting role. $10 per person Infant and Child CPR Parents, grandparents, babysitters, and friends learn Cardio-Pulmonary Resuscitation skills for infants and children. You will also learn how to treat both conscious and unconscious choking infants and children. Register each participant individually. (Note: This Family & Friends program does not offer  certification.) $20 per person Marvelous Multiples Expecting twins, triplets, or more? This free 2-hour class covers the differences in labor, birth, parenting, and breastfeeding issues that face multiples' parents.  Maternity Care Center Virtual Tour  Online virtual tour of the new [place] [facility] Talk This free mom-led group offers support and connection to mothers as they journey through the adjustments and struggles of that sometimes overwhelming first year after the birth of a child. A member of our staff will be present to share resources and additional support if needed, as you care for yourself and baby. You are welcome to visit this group before you deliver! It's wonderful to meet new friends early and watch other moms interact with their babies.  Waterbirth Class Interested in a waterbirth? This free informational class will help you discover whether waterbirth is the right fit for you and is required if you are planning a waterbirth. Education about waterbirth itself, supplies you may need, and what you may need from your support team is included in this class. Partners are encouraged to come.   [facility] offers online women's holistic mental health counseling and therapy provided by licensed mental health counselors and therapists.   You can refer yourself using the link below: (if it isn't clickable from your mychart account, copy and paste it in a new browser).  If you have ANY problems, please let me know and I will help [email or web address]   [email or web address]  OR  [email or web address]

## 2024-01-10 NOTE — Progress Notes (Signed)
 INITIAL OBSTETRICAL VISIT Patient name: Brooke Briggs MRN [id number]  Date of birth: [birth year] Chief Complaint:   Initial Prenatal Visit  History of Present Illness:   Brooke Briggs is a 33 y.o. G29P3003  female at [redacted]w[redacted]d by [place] at 6 weeks with an Estimated Date of Delivery: 07/13/24 being seen today for her initial obstetrical visit.   Her obstetrical history is significant for CHTN, HSV, anxiety and depression.  Hasn't taken BP meds in "a while".  Has had 3 term SVDs w/o problems. .   Today she reports no complaints.     01/10/2024   11:26 AM 08/11/2021   11:13 AM 04/02/2021    9:48 AM 01/02/2020   11:17 AM  Depression screen PHQ 2/9  Decreased Interest 0 0 2 0  Down, Depressed, Hopeless 0 0 0 0  PHQ - 2 Score 0 0 2 0  Altered sleeping 0 0 2   Tired, decreased energy 0 0 0   Change in appetite 0 0 1   Feeling bad or failure about yourself  0 0 0   Trouble concentrating 0 0 0   Moving slowly or fidgety/restless 0 0 0   Suicidal thoughts 0 0 0   PHQ-9 Score 0 0 5     No LMP recorded (lmp unknown). Patient is pregnant. Last pap  Diagnosis  Date Value Ref Range Status  01/02/2020   Final   - Negative for intraepithelial lesion or malignancy (NILM)   Review of Systems:   Pertinent items are noted in HPI Denies cramping/contractions, leakage of fluid, vaginal bleeding, abnormal vaginal discharge w/ itching/odor/irritation, headaches, visual changes, shortness of breath, chest pain, abdominal pain, severe nausea/vomiting, or problems with urination or bowel movements unless otherwise stated above.  Pertinent History Reviewed:  Reviewed past medical,surgical, social, obstetrical and family history.  Reviewed problem list, medications and allergies. OB History  Gravida Para Term Preterm AB Living  4 3 3   3   SAB IAB Ectopic Multiple Live Births     0 3    # Outcome Date GA Lbr Len/2nd Weight Sex Type Anes PTL Lv  4 Current           3 Term 10/22/21 [redacted]w[redacted]d 05:03 / 00:22 7 lb 2 oz (3.232  kg) M Vag-Spont None  LIV     Complications: Pre-eclampsia  2 Term 10/25/18 [redacted]w[redacted]d 01:32 / 00:11 6 lb 15.8 oz (3.17 kg) M Vag-Spont None  LIV  1 Term 01/02/16    M Vag-Spont   LIV   Physical Assessment:   Vitals:   01/10/24 1125 01/10/24 1134  BP: (!) 141/103 (!) 153/103  Pulse: 96 (!) 103  Weight: 146 lb (66.2 kg)   Body mass index is 25.06 kg/m.       Physical Examination:  General appearance - well appearing, and in no distress  Mental status - alert, oriented to person, place, and time  Psych:  She has a normal mood and affect  Skin - warm and dry, normal color, no suspicious lesions noted  Chest - effort normal  Heart - normal rate and regular rhythm  Abdomen - soft, nontender  Extremities:  No swelling or varicosities noted   No results found for this or any previous visit (from the past 24 hours).       01/10/2024   11:26 AM 08/11/2021   11:13 AM 04/02/2021    9:48 AM 01/02/2020   11:17 AM  Depression screen PHQ 2/9  Decreased Interest 0  0 2 0  Down, Depressed, Hopeless 0 0 0 0  PHQ - 2 Score 0 0 2 0  Altered sleeping 0 0 2   Tired, decreased energy 0 0 0   Change in appetite 0 0 1   Feeling bad or failure about yourself  0 0 0   Trouble concentrating 0 0 0   Moving slowly or fidgety/restless 0 0 0   Suicidal thoughts 0 0 0   PHQ-9 Score 0 0 5         01/10/2024   11:26 AM 08/11/2021   11:13 AM 04/02/2021    9:50 AM  GAD 7 : Generalized Anxiety Score  Nervous, Anxious, on Edge 0 0 3  Control/stop worrying 0 0 2  Worry too much - different things 0 0 2  Trouble relaxing 0 0 2  Restless 0 0 0  Easily annoyed or irritable 0 0 2  Afraid - awful might happen 0 0 2  Total GAD 7 Score 0 0 13      Assessment & Plan:  1) High-Risk Pregnancy G4P3003 at [redacted]w[redacted]d with an Estimated Date of Delivery: 07/13/24   2) Initial OB visit    1. Supervision of high risk pregnancy in first trimester (Primary)  - Integrated 1 - Urine Culture - Protein / creatinine ratio,  urine - GC/Chlamydia Probe Amp - Comprehensive metabolic panel - CBC/D/Plt+RPR+Rh+ABO+RubIgG... - CHL AMB BABYSCRIPTS SCHEDULE OPTIMIZATION - Hemoglobin A1c - PANORAMA PRENATAL TEST  2. [redacted] weeks gestation of pregnancy   3. Diabetes mellitus screening  - Hemoglobin A1c  4. Chronic hypertension Start ASA and labetalol 100mg  BID - Protein / creatinine ratio, urine - Comprehensive metabolic panel  5. Moderate episode of recurrent major depressive disorder (HCC) Contiue buspar and zoloft. Lun[place]oy info given  6. Recurrent genital HSV (herpes simplex virus) infection Refilled valtres       Meds:  Meds ordered this encounter  Medications   valACYclovir (VALTREX) 500 MG tablet    Sig: Take 1 tablet (500 mg total) by mouth 2 (two) times daily.    Dispense:  60 tablet    Refill:  6    Supervising Provider:   Duane Lope H [[id number]]   labetalol (NORMODYNE) 100 MG tablet    Sig: Take 1 tablet (100 mg total) by mouth 2 (two) times daily.    Dispense:  60 tablet    Refill:  6    Supervising Provider:   Lazaro Arms [[id number]]   aspirin EC 81 MG tablet    Sig: Take 2 tablets (162 mg total) by mouth daily.    Dispense:  60 tablet    Refill:  6    Supervising Provider:   Duane Lope H [[id number]]    Initial labs obtained Continue prenatal vitamins Reviewed n/v relief measures and warning s/s to report Reviewed recommended weight gain based on pre-gravid BMI Encouraged well-balanced diet Genetic & carrier screening discussed: requests Panorama, NT/IT, and Horizon , declines AFP Ultrasound discussed; fetal survey: requested CCNC completed> form faxed if has or is planning to apply for medicaid The nature of [facility] [employer] with multiple MDs and other Advanced Practice Providers was explained to patient; also emphasized that fellows, residents, and students are part of our team. Has home bp cuff. . Check bp weekly, let us know if >140/90.         3 weeks  HROB w/CNM or MD, 6 weeks for anatomy scan/HROB; 1 week mychart  video for RN BP check  Scarlette Calico Cresenzo-Dishmon 11:50 AM

## 2024-01-11 NOTE — Addendum Note (Signed)
 Addended by: Moss Mc on: 01/11/2024 01:56 PM   Modules accepted: Orders

## 2024-01-12 LAB — INTEGRATED 1

## 2024-01-12 LAB — GC/CHLAMYDIA PROBE AMP
Chlamydia trachomatis, NAA: NEGATIVE
Neisseria Gonorrhoeae by PCR: NEGATIVE

## 2024-01-13 LAB — INTEGRATED 1
Crown Rump Length: 71.4 mm
Gest. Age on Collection Date: 14.1 wk
PAPP-A Value: 5546.7 ng/mL
Race: 1
Sonographer ID#: 309760
Sonographer ID#: 33.5 a
Weight: 1.6 mm
Weight: 146 [lb_av]

## 2024-01-13 LAB — CBC/D/PLT+RPR+RH+ABO+RUBIGG...
Antibody Screen: NEGATIVE
Basophils Absolute: 0 10*3/uL (ref 0.0–0.2)
Basos: 0 %
EOS (ABSOLUTE): 0.1 10*3/uL (ref 0.0–0.4)
Eos: 1 %
HCV Ab: NONREACTIVE
HIV Screen 4th Generation wRfx: NONREACTIVE
Hematocrit: 38.5 % (ref 34.0–46.6)
Hemoglobin: 12.6 g/dL (ref 11.1–15.9)
Hepatitis B Surface Ag: NEGATIVE
Immature Grans (Abs): 0 10*3/uL (ref 0.0–0.1)
Immature Granulocytes: 0 %
Lymphocytes Absolute: 2 10*3/uL (ref 0.7–3.1)
Lymphs: 17 %
MCH: 27.5 pg (ref 26.6–33.0)
MCHC: 32.7 g/dL (ref 31.5–35.7)
MCV: 84 fL (ref 79–97)
Monocytes Absolute: 0.6 10*3/uL (ref 0.1–0.9)
Monocytes: 5 %
Neutrophils Absolute: 9 10*3/uL — ABNORMAL HIGH (ref 1.4–7.0)
Neutrophils: 77 %
Platelets: 334 10*3/uL (ref 150–450)
RBC: 4.59 x10E6/uL (ref 3.77–5.28)
RDW: 12.9 % (ref 11.7–15.4)
RPR Ser Ql: NONREACTIVE
Rh Factor: POSITIVE
Rubella Antibodies, IGG: 2.18 {index} (ref >0.99)
WBC: 11.8 10*3/uL — ABNORMAL HIGH (ref 3.4–10.8)

## 2024-01-13 LAB — COMPREHENSIVE METABOLIC PANEL
ALT: 11 IU/L (ref 0–32)
AST: 9 IU/L (ref 0–40)
Albumin: 4 g/dL (ref 3.9–4.9)
Alkaline Phosphatase: 76 IU/L (ref 44–121)
BUN/Creatinine Ratio: 9 (ref 9–23)
BUN: 6 mg/dL (ref 6–20)
Bilirubin Total: <0.2 mg/dL (ref 0.0–1.2)
CO2: 22 mmol/L (ref 20–29)
Calcium: 9.3 mg/dL (ref 8.7–10.2)
Chloride: 99 mmol/L (ref 96–106)
Creatinine, Ser: 0.7 mg/dL (ref 0.57–1.00)
Globulin, Total: 2.7 g/dL (ref 1.5–4.5)
Glucose: 57 mg/dL — ABNORMAL LOW (ref 70–99)
Potassium: 3.9 mmol/L (ref 3.5–5.2)
Sodium: 133 mmol/L — ABNORMAL LOW (ref 134–144)
Total Protein: 6.7 g/dL (ref 6.0–8.5)
eGFR: 117 mL/min/{1.73_m2} (ref >59)

## 2024-01-13 LAB — HEMOGLOBIN A1C
Est. average glucose Bld gHb Est-mCnc: 111 mg/dL
Hgb A1c MFr Bld: 5.5 % (ref 4.8–5.6)

## 2024-01-13 LAB — HCV INTERPRETATION

## 2024-01-13 LAB — URINE CULTURE

## 2024-01-13 LAB — PROTEIN / CREATININE RATIO, URINE
Creatinine, Urine: 234.8 mg/dL
Protein, Ur: 64.8 mg/dL
Protein/Creat Ratio: 276 mg/g{creat} — ABNORMAL HIGH (ref 0–200)

## 2024-01-17 ENCOUNTER — Telehealth

## 2024-01-23 LAB — PANORAMA PRENATAL TEST FULL PANEL:PANORAMA TEST PLUS 5 ADDITIONAL MICRODELETIONS: FETAL FRACTION: 11.9

## 2024-01-31 ENCOUNTER — Encounter: Admitting: Obstetrics & Gynecology

## 2024-02-01 ENCOUNTER — Ambulatory Visit: Admitting: Obstetrics & Gynecology

## 2024-02-01 ENCOUNTER — Encounter: Payer: Self-pay | Admitting: Obstetrics & Gynecology

## 2024-02-01 VITALS — BP 142/91 | HR 81 | Wt 149.2 lb

## 2024-02-01 DIAGNOSIS — Z1379 Encounter for other screening for genetic and chromosomal anomalies: Secondary | ICD-10-CM

## 2024-02-01 DIAGNOSIS — O10912 Unspecified pre-existing hypertension complicating pregnancy, second trimester: Secondary | ICD-10-CM | POA: Diagnosis not present

## 2024-02-01 DIAGNOSIS — Z3A16 16 weeks gestation of pregnancy: Secondary | ICD-10-CM | POA: Diagnosis not present

## 2024-02-01 DIAGNOSIS — O10919 Unspecified pre-existing hypertension complicating pregnancy, unspecified trimester: Secondary | ICD-10-CM

## 2024-02-01 DIAGNOSIS — O0992 Supervision of high risk pregnancy, unspecified, second trimester: Secondary | ICD-10-CM | POA: Diagnosis not present

## 2024-02-01 MED ORDER — LABETALOL HCL 200 MG PO TABS: 200.0000 mg | ORAL_TABLET | Freq: Two times a day (BID) | ORAL | 11 refills | Status: DC

## 2024-02-01 NOTE — Progress Notes (Signed)
 HIGH-RISK PREGNANCY VISIT Patient name: Brooke Briggs MRN [id number]  Date of birth: 1991-07-21 Chief Complaint:   Routine Prenatal Visit  History of Present Illness:   Brooke Briggs is a 33 y.o. G35P3003 female at [redacted]w[redacted]d with an Estimated Date of Delivery: 07/13/24 being seen today for ongoing management of a high-risk pregnancy complicated by :  -Chronic HTN: Currently on the 100 mg twice daily She does note occasional headache taking Tylenol when needed.  Denies blurry vision or right upper quadrant pain   Contractions: Not present. Vag. Bleeding: None.  Movement: Absent. denies leaking of fluid.      01/10/2024   11:26 AM 08/11/2021   11:13 AM 04/02/2021    9:48 AM 01/02/2020   11:17 AM  Depression screen PHQ 2/9  Decreased Interest 0 0 2 0  Down, Depressed, Hopeless 0 0 0 0  PHQ - 2 Score 0 0 2 0  Altered sleeping 0 0 2   Tired, decreased energy 0 0 0   Change in appetite 0 0 1   Feeling bad or failure about yourself  0 0 0   Trouble concentrating 0 0 0   Moving slowly or fidgety/restless 0 0 0   Suicidal thoughts 0 0 0   PHQ-9 Score 0 0 5      Current Outpatient Medications  Medication Instructions   aspirin EC 162 mg, Oral, Daily   busPIRone (BUSPAR) 10 mg, 3 times daily   labetalol (NORMODYNE) 200 mg, Oral, 2 times daily   sertraline (ZOLOFT) 50 mg, Daily   valACYclovir (VALTREX) 500 mg, Oral, 2 times daily     Review of Systems:   Pertinent items are noted in HPI Denies abnormal vaginal discharge w/ itching/odor/irritation, headaches, visual changes, shortness of breath, chest pain, abdominal pain, severe nausea/vomiting, or problems with urination or bowel movements unless otherwise stated above. Pertinent History Reviewed:  Reviewed past medical,surgical, social, obstetrical and family history.  Reviewed problem list, medications and allergies. Physical Assessment:   Vitals:   02/01/24 1347 02/01/24 1412  BP: (!) 140/90 (!) 142/91  Pulse: 78 81  Weight: 149 lb  3.2 oz (67.7 kg)   Body mass index is 25.61 kg/m.           Physical Examination:   General appearance: alert, well appearing, and in no distress  Mental status: normal mood, behavior, speech, dress, motor activity, and thought processes  Skin: warm & dry   Extremities:      Cardiovascular: normal heart rate noted  Respiratory: normal respiratory effort, no distress  Abdomen: gravid, soft, non-tender  Pelvic: Cervical exam deferred         Fetal Status: Fetal Heart Rate (bpm): 155   Movement: Absent    Fetal Surveillance Testing today: doppler   Chaperone: N/A    No results found for this or any previous visit (from the past 24 hours).   Assessment & Plan:  High-risk pregnancy: [id number] at [redacted]w[redacted]d with an Estimated Date of Delivery: 07/13/24   1. Genetic screening  - INTEGRATED 2  2. [redacted] weeks gestation of pregnancy  - INTEGRATED 2  3. Chronic hypertension affecting pregnancy -increase to Labetalol 200mg  bid  4. Supervision of high risk pregnancy in second trimester (Primary) -anatomy scan next visit  Meds:  Meds ordered this encounter  Medications   labetalol (NORMODYNE) 200 MG tablet    Sig: Take 1 tablet (200 mg total) by mouth 2 (two) times daily.    Dispense:  60 tablet  Refill:  11    Labs/procedures today: IT2 testing,   Treatment Plan:  routine OB care and as outlined above  Reviewed: Preterm labor symptoms and general obstetric precautions including but not limited to vaginal bleeding, contractions, leaking of fluid and fetal movement were reviewed in detail with the patient.  All questions were answered. Pt has home bp cuff. Check bp weekly, let us know if [id number].   Follow-up: Return in about 4 weeks (around 02/29/2024) for LROB visit and anatomy scan.   Future Appointments  Date Time Provider Department Center  02/21/2024 10:45 AM [facility] [place] [facility] None  02/21/2024 11:50 AM Eure, Amaryllis Dyke, MD [facility]    Orders Placed This Encounter   Procedures   INTEGRATED 2    Myna Hidalgo, DO Attending Obstetrician & Gynecologist, [facility] for [facility], [facility]

## 2024-02-03 ENCOUNTER — Encounter: Payer: Self-pay | Admitting: Advanced Practice Midwife

## 2024-02-03 ENCOUNTER — Encounter: Payer: Self-pay | Admitting: Obstetrics & Gynecology

## 2024-02-03 DIAGNOSIS — Z148 Genetic carrier of other disease: Secondary | ICD-10-CM | POA: Insufficient documentation

## 2024-02-03 LAB — INTEGRATED 2
AFP MoM: 0.89
Alpha-Fetoprotein: 36.9 ng/mL
Crown Rump Length: 71.4 mm
DIA MoM: 0.97
DIA Value: 154.8 pg/mL
Estriol, Unconjugated: 1.93 ng/mL
Gest. Age on Collection Date: 14.1 wk
Gestational Age: 17.3 wk
Maternal Age at EDD: 33.5 a
Nuchal Translucency (NT): 1.6 mm
Nuchal Translucency MoM: 0.82
Number of Fetuses: 1
PAPP-A Value: 5546.7 ng/mL
Sonographer ID#: 309760
Test Results:: NEGATIVE
Weight: 146 [lb_av]
Weight: 146 [lb_av]
hCG MoM: 2.44
hCG Value: 72.6 [IU]/mL
uE3 MoM: 1.41

## 2024-02-03 LAB — HORIZON CUSTOM: REPORT SUMMARY: POSITIVE — AB

## 2024-02-21 ENCOUNTER — Ambulatory Visit: Admitting: Advanced Practice Midwife

## 2024-02-21 ENCOUNTER — Encounter: Payer: Self-pay | Admitting: Advanced Practice Midwife

## 2024-02-21 ENCOUNTER — Ambulatory Visit

## 2024-02-21 VITALS — BP 161/112 | HR 73 | Wt 150.0 lb

## 2024-02-21 DIAGNOSIS — Z3A19 19 weeks gestation of pregnancy: Secondary | ICD-10-CM | POA: Diagnosis not present

## 2024-02-21 DIAGNOSIS — O10912 Unspecified pre-existing hypertension complicating pregnancy, second trimester: Secondary | ICD-10-CM

## 2024-02-21 DIAGNOSIS — Z363 Encounter for antenatal screening for malformations: Secondary | ICD-10-CM

## 2024-02-21 DIAGNOSIS — O0992 Supervision of high risk pregnancy, unspecified, second trimester: Secondary | ICD-10-CM | POA: Diagnosis not present

## 2024-02-21 DIAGNOSIS — O10919 Unspecified pre-existing hypertension complicating pregnancy, unspecified trimester: Secondary | ICD-10-CM

## 2024-02-21 NOTE — Progress Notes (Signed)
 HIGH-RISK PREGNANCY VISIT Patient name: Brooke Briggs MRN [id number]  Date of birth: Dec 06, 1990 Chief Complaint:   Routine Prenatal Visit and Pregnancy Ultrasound (pap)  History of Present Illness:   Brooke Briggs is a 33 y.o. G36P3003 female at [redacted]w[redacted]d with an Estimated Date of Delivery: 07/13/24 being seen today for ongoing management of a high-risk pregnancy complicated by chronic hypertension currently on labetalol 200mg  BID.   (DID NOT TAKE THIS AM)  Today she reports no complaints.  .  .   . denies leaking of fluid.      01/10/2024   11:26 AM 08/11/2021   11:13 AM 04/02/2021    9:48 AM 01/02/2020   11:17 AM  Depression screen PHQ 2/9  Decreased Interest 0 0 2 0  Down, Depressed, Hopeless 0 0 0 0  PHQ - 2 Score 0 0 2 0  Altered sleeping 0 0 2   Tired, decreased energy 0 0 0   Change in appetite 0 0 1   Feeling bad or failure about yourself  0 0 0   Trouble concentrating 0 0 0   Moving slowly or fidgety/restless 0 0 0   Suicidal thoughts 0 0 0   PHQ-9 Score 0 0 5         01/10/2024   11:26 AM 08/11/2021   11:13 AM 04/02/2021    9:50 AM  GAD 7 : Generalized Anxiety Score  Nervous, Anxious, on Edge 0 0 3  Control/stop worrying 0 0 2  Worry too much - different things 0 0 2  Trouble relaxing 0 0 2  Restless 0 0 0  Easily annoyed or irritable 0 0 2  Afraid - awful might happen 0 0 2  Total GAD 7 Score 0 0 13     Review of Systems:   Pertinent items are noted in HPI Denies abnormal vaginal discharge w/ itching/odor/irritation, headaches, visual changes, shortness of breath, chest pain, abdominal pain, severe nausea/vomiting, or problems with urination or bowel movements unless otherwise stated above. Pertinent History Reviewed:  Reviewed past medical,surgical, social, obstetrical and family history.  Reviewed problem list, medications and allergies. Physical Assessment:  There were no vitals filed for this visit.There is no height or weight on file to calculate BMI.            Physical Examination:   General appearance: alert, well appearing, and in no distress  Mental status: alert, oriented to person, place, and time  Skin: warm & dry   Extremities:      Cardiovascular: normal heart rate noted  Respiratory: normal respiratory effort, no distress  Abdomen: gravid, soft, non-tender  Pelvic: Cervical exam deferred         Chaperone: N/A    Fetal Status:          Fetal Surveillance Testing today: [place] 19+4 wks,breech,posterior placenta gr 0,normal ovaries,CX 2.8 cm,FHR 157 bmp,SVP of fluid 4 CM,EFW 337 g 77%,anatomy complete,no obvious abnormalities      No results found for this or any previous visit (from the past 24 hours).  Assessment & Plan:  High-risk pregnancy: [id number] at [redacted]w[redacted]d with an Estimated Date of Delivery: 07/13/24   1. [redacted] weeks gestation of pregnancy (Primary)   2. Chronic hypertension affecting pregnancy ASA 162mg  [x]         Meds: labetalol 200 BID       EFW q 4w @ 24w    2x/wk testing nst/sono @ 32wks     Deliver 37-39.0wks (or as per  MFM w/ poor control)____   3. Supervision of high risk pregnancy in second trimester     Meds: No orders of the defined types were placed in this encounter.   Orders: No orders of the defined types were placed in this encounter.    Labs/procedures today: U/S   Reviewed:  general obstetric precautions including but not limited to vaginal bleeding, contractions, leaking of fluid and fetal movement were reviewed in detail with the patient.  All questions were answered. Does have home bp cuff. Office bp cuff given: not applicable. Check bp weekly, let us  know if consistently >140 and/or >90.  Follow-up: Return for q 4 weeks US  for EFW/ 4 weeks HROB .   Future Appointments  Date Time Provider Department Center  02/21/2024 11:50 AM Cresenzo-Dishmon, Blanca Bunch, CNM [facility]    No orders of the defined types were placed in this encounter.  Majel Scott , DNP, CNM [facility] 02/21/2024 11:35 AM

## 2024-02-21 NOTE — Progress Notes (Signed)
 US  19+4 wks,breech,posterior placenta gr 0,normal ovaries,CX 2.8 cm,FHR 157 bmp,SVP of fluid 4 CM,EFW 337 g 77%,anatomy complete,no obvious abnormalities

## 2024-03-21 ENCOUNTER — Encounter: Admitting: Women's Health

## 2024-03-21 ENCOUNTER — Other Ambulatory Visit: Admitting: Radiology

## 2024-03-22 ENCOUNTER — Encounter: Payer: Self-pay | Admitting: Obstetrics & Gynecology

## 2024-03-22 ENCOUNTER — Ambulatory Visit: Admitting: Radiology

## 2024-03-22 ENCOUNTER — Ambulatory Visit: Admitting: Obstetrics & Gynecology

## 2024-03-22 VITALS — BP 122/80 | HR 79 | Wt 159.0 lb

## 2024-03-22 DIAGNOSIS — O10919 Unspecified pre-existing hypertension complicating pregnancy, unspecified trimester: Secondary | ICD-10-CM

## 2024-03-22 DIAGNOSIS — O0992 Supervision of high risk pregnancy, unspecified, second trimester: Secondary | ICD-10-CM

## 2024-03-22 DIAGNOSIS — B009 Herpesviral infection, unspecified: Secondary | ICD-10-CM

## 2024-03-22 DIAGNOSIS — O10912 Unspecified pre-existing hypertension complicating pregnancy, second trimester: Secondary | ICD-10-CM

## 2024-03-22 DIAGNOSIS — Z3A23 23 weeks gestation of pregnancy: Secondary | ICD-10-CM

## 2024-03-22 DIAGNOSIS — O98512 Other viral diseases complicating pregnancy, second trimester: Secondary | ICD-10-CM

## 2024-03-22 DIAGNOSIS — F419 Anxiety disorder, unspecified: Secondary | ICD-10-CM | POA: Diagnosis not present

## 2024-03-22 NOTE — Progress Notes (Addendum)
 US : GA = 23+6 weeks Single active female fetus, cephalic, FHR = 150 bpm, post pl, gr1, MVP = 6.1 cm, EFW 54%, 664g,  nl ov's,  CL = 4 cm, closed

## 2024-03-22 NOTE — Progress Notes (Signed)
 HIGH-RISK PREGNANCY VISIT Patient name: Brooke Briggs MRN [id number]  Date of birth: 04-Jan-1991 Chief Complaint:   Routine Prenatal Visit  History of Present Illness:   Brooke Briggs is a 33 y.o. G79P3003 female at [redacted]w[redacted]d with an Estimated Date of Delivery: 07/13/24 being seen today for ongoing management of a high-risk pregnancy complicated by:  -Chronic HTN- Labetalol  200mg  bid -Anxiety/Depression- on zoloft  buspar - no change  Today she reports no complaints.   Contractions: Not present. Vag. Bleeding: None.  Movement: Present. denies leaking of fluid.      01/10/2024   11:26 AM 08/11/2021   11:13 AM 04/02/2021    9:48 AM 01/02/2020   11:17 AM  Depression screen PHQ 2/9  Decreased Interest 0 0 2 0  Down, Depressed, Hopeless 0 0 0 0  PHQ - 2 Score 0 0 2 0  Altered sleeping 0 0 2   Tired, decreased energy 0 0 0   Change in appetite 0 0 1   Feeling bad or failure about yourself  0 0 0   Trouble concentrating 0 0 0   Moving slowly or fidgety/restless 0 0 0   Suicidal thoughts 0 0 0   PHQ-9 Score 0 0 5      Current Outpatient Medications  Medication Instructions   aspirin  EC 162 mg, Oral, Daily   busPIRone  (BUSPAR ) 10 mg, 3 times daily   labetalol  (NORMODYNE ) 200 mg, Oral, 2 times daily   sertraline  (ZOLOFT ) 50 mg, Daily   valACYclovir  (VALTREX ) 500 mg, Oral, 2 times daily     Review of Systems:   Pertinent items are noted in HPI Denies abnormal vaginal discharge w/ itching/odor/irritation, headaches, visual changes, shortness of breath, chest pain, abdominal pain, severe nausea/vomiting, or problems with urination or bowel movements unless otherwise stated above. Pertinent History Reviewed:  Reviewed past medical,surgical, social, obstetrical and family history.  Reviewed problem list, medications and allergies. Physical Assessment:   Vitals:   03/22/24 1058  BP: 122/80  Pulse: 79  Weight: 159 lb (72.1 kg)  Body mass index is 27.29 kg/m.           Physical Examination:    General appearance: alert, well appearing, and in no distress  Mental status: normal mood, behavior, speech, dress, motor activity, and thought processes  Skin: warm & dry   Extremities:   no edema   Cardiovascular: normal heart rate noted  Respiratory: normal respiratory effort, no distress  Abdomen: gravid, soft, non-tender  Pelvic: Cervical exam deferred         Fetal Status:     Movement: Present    Fetal Surveillance Testing today: Single active female fetus, cephalic, FHR = 150 bpm, post pl, gr1, MVP = 6.1 cm, EFW 54%, 664g,  nl ov's,  CL = 4 cm, closed    Chaperone: N/A    No results found for this or any previous visit (from the past 24 hours).   Assessment & Plan:  High-risk pregnancy: [id number] at [redacted]w[redacted]d with an Estimated Date of Delivery: 07/13/24   1. Chronic hypertension affecting pregnancy -AGA growth today, continue q 4wks -BP stable with current medication -Asx  2. Supervision of high risk pregnancy in second trimester (Primary) PN2 next visit  3. Anxiety No change to current medication  5. 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   Labs/procedures today: growth scan  Treatment Plan:  routine OB care and as outlined above  Reviewed: Preterm labor symptoms and general obstetric precautions including but not limited to vaginal bleeding, contractions, leaking of fluid and fetal movement were reviewed in detail with the patient.  All questions were answered. Pt has home bp cuff. Check bp weekly, let us  know if >160/110.   Follow-up: Return in about 4 weeks (around 04/19/2024) for HROB visit, PN2 and growth every 4wks.   Future Appointments  Date Time Provider Department Center  04/19/2024  8:30 AM [facility]  04/19/2024  9:15 AM [facility] US  [facility] None  04/19/2024 10:10 AM Ferd Householder, CNM [facility]    No orders of the defined types were placed in this encounter.   Corlene Sabia, DO Attending Obstetrician & Gynecologist, [facility] for [name], [facility]

## 2024-04-19 ENCOUNTER — Ambulatory Visit: Admitting: Women's Health

## 2024-04-19 ENCOUNTER — Encounter: Payer: Self-pay | Admitting: Women's Health

## 2024-04-19 ENCOUNTER — Encounter

## 2024-04-19 ENCOUNTER — Other Ambulatory Visit

## 2024-04-19 ENCOUNTER — Ambulatory Visit (INDEPENDENT_AMBULATORY_CARE_PROVIDER_SITE_OTHER)

## 2024-04-19 VITALS — BP 152/92 | HR 75 | Wt 159.0 lb

## 2024-04-19 DIAGNOSIS — R8271 Bacteriuria: Secondary | ICD-10-CM

## 2024-04-19 DIAGNOSIS — Z131 Encounter for screening for diabetes mellitus: Secondary | ICD-10-CM

## 2024-04-19 DIAGNOSIS — Z331 Pregnant state, incidental: Secondary | ICD-10-CM

## 2024-04-19 DIAGNOSIS — Z148 Genetic carrier of other disease: Secondary | ICD-10-CM

## 2024-04-19 DIAGNOSIS — Z3A27 27 weeks gestation of pregnancy: Secondary | ICD-10-CM | POA: Diagnosis not present

## 2024-04-19 DIAGNOSIS — Z23 Encounter for immunization: Secondary | ICD-10-CM

## 2024-04-19 DIAGNOSIS — O0992 Supervision of high risk pregnancy, unspecified, second trimester: Secondary | ICD-10-CM

## 2024-04-19 DIAGNOSIS — O99891 Other specified diseases and conditions complicating pregnancy: Secondary | ICD-10-CM

## 2024-04-19 DIAGNOSIS — I10 Essential (primary) hypertension: Secondary | ICD-10-CM

## 2024-04-19 DIAGNOSIS — Z1389 Encounter for screening for other disorder: Secondary | ICD-10-CM

## 2024-04-19 DIAGNOSIS — O10912 Unspecified pre-existing hypertension complicating pregnancy, second trimester: Secondary | ICD-10-CM

## 2024-04-19 DIAGNOSIS — O10919 Unspecified pre-existing hypertension complicating pregnancy, unspecified trimester: Secondary | ICD-10-CM

## 2024-04-19 LAB — POCT URINALYSIS DIPSTICK OB
Blood, UA: NEGATIVE
Glucose, UA: NEGATIVE
Ketones, UA: NEGATIVE
Nitrite, UA: NEGATIVE

## 2024-04-19 NOTE — Progress Notes (Signed)
 US  27+6 wks,cephalic,posterior placenta gr 0,normal ovaries,FHR 148 bpm,AFI 11 cm,EFW 1215 g 57%

## 2024-04-19 NOTE — Patient Instructions (Signed)
Brooke Briggs, thank you for choosing our office today! We appreciate the opportunity to meet your healthcare needs. You may receive a short survey by mail, e-mail, or through [employer]. If you are happy with your care we would appreciate if you could take just a few minutes to complete the survey questions. We read all of your comments and take your feedback very seriously. Thank you again for choosing our office.  Center for [name] Team at [facility]  [facility] at [facility] ([address] [place], [place] [address]) Entrance C, located off of [address] Free 24/7 valet parking   CLASSES: Go to [email or web address] to register for classes (childbirth, breastfeeding, waterbirth, infant CPR, daddy bootcamp, etc.)  Call the office [phone]) or go to [facility] if: You begin to have strong, frequent contractions Your water breaks.  Sometimes it is a big gush of fluid, sometimes it is just a trickle that keeps getting your panties wet or running down your legs You have vaginal bleeding.  It is normal to have a small amount of spotting if your cervix was checked.  You don't feel your baby moving like normal.  If you don't, get you something to eat and drink and lay down and focus on feeling your baby move.   If your baby is still not moving like normal, you should call the office or go to [facility]  Call the office [phone]) or go to [facility] hospital for these signs of pre-eclampsia: Severe headache that does not go away with Tylenol Visual changes- seeing spots, double, blurred vision Pain under your right breast or upper abdomen that does not go away with Tums or heartburn medicine Nausea and/or vomiting Severe swelling in your hands, feet, and face   Tdap Vaccine It is recommended that you get the Tdap vaccine during the third trimester of EACH pregnancy to help protect your baby from getting pertussis (whooping cough) 27-36 weeks is the BEST time to do  this so that you can pass the protection on to your baby. During pregnancy is better than after pregnancy, but if you are unable to get it during pregnancy it will be offered at the hospital.  You can get this vaccine with [place], at the health department, your family doctor, or some local pharmacies Everyone who will be around your baby should also be up-to-date on their vaccines before the baby comes. Adults (who are not pregnant) only need 1 dose of Tdap during adulthood.   [facility] Pediatricians/Family Doctors [facility]): [address] Colette Ribas, [phone]           [facility]: [address], [phone]                [facility]): [address], [phone] (call to ask if accepting patients) [facility] Department: [address], [place], [phone]    [facility] Pediatricians/Family Doctors [facility]): [phone] [address], [phone] [facility]: [address] [place], [phone] [facility]: [address], [phone]  [facility] Doctors  [facility]): [phone] [facility]: [address] [phone]   [facility] Doctors [facility]: [address], [phone]  [facility] Family Doctors  [facility]: [phone], [phone]  Home Blood Pressure Monitoring for Patients   Your provider has recommended that you check your  blood pressure (BP) at least once a week at home. If you do not have a blood pressure cuff at home, one will be provided for you. Contact your provider if you have not received your monitor within 1 week.   Helpful Tips for Accurate Home Blood Pressure Checks  Don't smoke, exercise, or drink caffeine 30 minutes before checking your BP Use the restroom before checking your BP (a full bladder can raise your  pressure) Relax in a comfortable upright chair Feet on the ground Left arm resting comfortably on a flat surface at the level of your heart Legs uncrossed Back supported Sit quietly and don't talk Place the cuff on your bare arm Adjust snuggly, so that only two fingertips can fit between your skin and the top of the cuff Check 2 readings separated by at least one minute Keep a log of your BP readings For a visual, please reference this diagram: http://ccnc.care/bpdiagram  Provider Name: [facility]     Phone: [phone]  Zone 1: ALL CLEAR  Continue to monitor your symptoms:  BP reading is less than 140 (top number) or less than 90 (bottom number)  No right upper stomach pain No headaches or seeing spots No feeling nauseated or throwing up No swelling in face and hands  Zone 2: CAUTION Call your doctor's office for any of the following:  BP reading is greater than 140 (top number) or greater than 90 (bottom number)  Stomach pain under your ribs in the middle or right side Headaches or seeing spots Feeling nauseated or throwing up Swelling in face and hands  Zone 3: EMERGENCY  Seek immediate medical care if you have any of the following:  BP reading is greater than160 (top number) or greater than 110 (bottom number) Severe headaches not improving with Tylenol Serious difficulty catching your breath Any worsening symptoms from Zone 2   Third Trimester of Pregnancy The third trimester is from week 29 through week 42, months 7 through 9. The third trimester is a time when the fetus is growing rapidly. At the end of the ninth month, the fetus is about 20 inches in length and weighs 6-10 pounds.  BODY CHANGES Your body goes through many changes during pregnancy. The changes vary from woman to woman.  Your weight will continue to increase. You can expect to gain 25-35 pounds (11-16 kg) by the end of the pregnancy. You may begin to get stretch marks on your hips, abdomen,  and breasts. You may urinate more often because the fetus is moving lower into your pelvis and pressing on your bladder. You may develop or continue to have heartburn as a result of your pregnancy. You may develop constipation because certain hormones are causing the muscles that push waste through your intestines to slow down. You may develop hemorrhoids or swollen, bulging veins (varicose veins). You may have pelvic pain because of the weight gain and pregnancy hormones relaxing your joints between the bones in your pelvis. Backaches may result from overexertion of the muscles supporting your posture. You may have changes in your hair. These can include thickening of your hair, rapid growth, and changes in texture. Some women also have hair loss during or after pregnancy, or hair that feels dry or thin. Your hair will most likely return to normal after your baby is born. Your breasts will continue to grow and be tender. A yellow discharge may leak from your breasts called colostrum. Your belly button may stick out. You may   feel short of breath because of your expanding uterus. You may notice the fetus "dropping," or moving lower in your abdomen. You may have a bloody mucus discharge. This usually occurs a few days to a week before labor begins. Your cervix becomes thin and soft (effaced) near your due date. WHAT TO EXPECT AT YOUR PRENATAL EXAMS  You will have prenatal exams every 2 weeks until week 36. Then, you will have weekly prenatal exams. During a routine prenatal visit: You will be weighed to make sure you and the fetus are growing normally. Your blood pressure is taken. Your abdomen will be measured to track your baby's growth. The fetal heartbeat will be listened to. Any test results from the previous visit will be discussed. You may have a cervical check near your due date to see if you have effaced. At around 36 weeks, your caregiver will check your cervix. At the same time, your  caregiver will also perform a test on the secretions of the vaginal tissue. This test is to determine if a type of bacteria, Group B streptococcus, is present. Your caregiver will explain this further. Your caregiver may ask you: What your birth plan is. How you are feeling. If you are feeling the baby move. If you have had any abnormal symptoms, such as leaking fluid, bleeding, severe headaches, or abdominal cramping. If you have any questions. Other tests or screenings that may be performed during your third trimester include: Blood tests that check for low iron levels (anemia). Fetal testing to check the health, activity level, and growth of the fetus. Testing is done if you have certain medical conditions or if there are problems during the pregnancy. FALSE LABOR You may feel small, irregular contractions that eventually go away. These are called Braxton Hicks contractions, or false labor. Contractions may last for hours, days, or even weeks before true labor sets in. If contractions come at regular intervals, intensify, or become painful, it is best to be seen by your caregiver.  SIGNS OF LABOR  Menstrual-like cramps. Contractions that are 5 minutes apart or less. Contractions that start on the top of the uterus and spread down to the lower abdomen and back. A sense of increased pelvic pressure or back pain. A watery or bloody mucus discharge that comes from the vagina. If you have any of these signs before the 37th week of pregnancy, call your caregiver right away. You need to go to the hospital to get checked immediately. HOME CARE INSTRUCTIONS  Avoid all smoking, herbs, alcohol, and unprescribed drugs. These chemicals affect the formation and growth of the baby. Follow your caregiver's instructions regarding medicine use. There are medicines that are either safe or unsafe to take during pregnancy. Exercise only as directed by your caregiver. Experiencing uterine cramps is a good sign to  stop exercising. Continue to eat regular, healthy meals. Wear a good support bra for breast tenderness. Do not use hot tubs, steam rooms, or saunas. Wear your seat belt at all times when driving. Avoid raw meat, uncooked cheese, cat litter boxes, and soil used by cats. These carry germs that can cause birth defects in the baby. Take your prenatal vitamins. Try taking a stool softener (if your caregiver approves) if you develop constipation. Eat more high-fiber foods, such as fresh vegetables or fruit and whole grains. Drink plenty of fluids to keep your urine clear or pale yellow. Take warm sitz baths to soothe any pain or discomfort caused by hemorrhoids. Use hemorrhoid cream if   your caregiver approves. If you develop varicose veins, wear support hose. Elevate your feet for 15 minutes, 3-4 times a day. Limit salt in your diet. Avoid heavy lifting, wear low heal shoes, and practice good posture. Rest a lot with your legs elevated if you have leg cramps or low back pain. Visit your dentist if you have not gone during your pregnancy. Use a soft toothbrush to brush your teeth and be gentle when you floss. A sexual relationship may be continued unless your caregiver directs you otherwise. Do not travel far distances unless it is absolutely necessary and only with the approval of your caregiver. Take prenatal classes to understand, practice, and ask questions about the labor and delivery. Make a trial run to the hospital. Pack your hospital bag. Prepare the baby's nursery. Continue to go to all your prenatal visits as directed by your caregiver. SEEK MEDICAL CARE IF: You are unsure if you are in labor or if your water has broken. You have dizziness. You have mild pelvic cramps, pelvic pressure, or nagging pain in your abdominal area. You have persistent nausea, vomiting, or diarrhea. You have a bad smelling vaginal discharge. You have pain with urination. SEEK IMMEDIATE MEDICAL CARE IF:  You  have a fever. You are leaking fluid from your vagina. You have spotting or bleeding from your vagina. You have severe abdominal cramping or pain. You have rapid weight loss or gain. You have shortness of breath with chest pain. You notice sudden or extreme swelling of your face, hands, ankles, feet, or legs. You have not felt your baby move in over an hour. You have severe headaches that do not go away with medicine. You have vision changes. Document Released: 10/20/2001 Document Revised: 10/31/2013 Document Reviewed: 12/27/2012 ExitCare Patient Information 2015 [employer]. This information is not intended to replace advice given to you by your health care provider. Make sure you discuss any questions you have with your health care provider.       

## 2024-04-19 NOTE — Progress Notes (Signed)
 HIGH-RISK PREGNANCY VISIT Patient name: Brooke Briggs MRN [id number]  Date of birth: 1991-09-22 Chief Complaint:   Routine Prenatal Visit and Pregnancy Ultrasound (PN2. No medication taken do lab work)  History of Present Illness:   Brooke Briggs is a 33 y.o. [phone] female at [redacted]w[redacted]d with an Estimated Date of Delivery: 07/13/24 being seen today for ongoing management of a high-risk pregnancy complicated by chronic hypertension currently on labetalol  200mg  BID.    Today she reports didn't take labetalol  this am d/t pn2. Last checked bp on Sat 6/7 and was 132/81. Contractions: Irritability.  .  Movement: Present. denies leaking of fluid.      04/19/2024   10:18 AM 01/10/2024   11:26 AM 08/11/2021   11:13 AM 04/02/2021    9:48 AM 01/02/2020   11:17 AM  Depression screen PHQ 2/9  Decreased Interest 0 0 0 2 0  Down, Depressed, Hopeless 0 0 0 0 0  PHQ - 2 Score 0 0 0 2 0  Altered sleeping 0 0 0 2   Tired, decreased energy 0 0 0 0   Change in appetite 0 0 0 1   Feeling bad or failure about yourself  0 0 0 0   Trouble concentrating 0 0 0 0   Moving slowly or fidgety/restless 0 0 0 0   Suicidal thoughts 0 0 0 0   PHQ-9 Score 0 0 0 5         01/10/2024   11:26 AM 08/11/2021   11:13 AM 04/02/2021    9:50 AM  GAD 7 : Generalized Anxiety Score  Nervous, Anxious, on Edge 0 0 3  Control/stop worrying 0 0 2  Worry too much - different things 0 0 2  Trouble relaxing 0 0 2  Restless 0 0 0  Easily annoyed or irritable 0 0 2  Afraid - awful might happen 0 0 2  Total GAD 7 Score 0 0 13     Review of Systems:   Pertinent items are noted in HPI Denies abnormal vaginal discharge w/ itching/odor/irritation, headaches, visual changes, shortness of breath, chest pain, abdominal pain, severe nausea/vomiting, or problems with urination or bowel movements unless otherwise stated above. Pertinent History Reviewed:  Reviewed past medical,surgical, social, obstetrical and family history.  Reviewed problem list,  medications and allergies. Physical Assessment:   Vitals:   04/19/24 1014 04/19/24 1047  BP: (!) 150/98 (!) 152/92  Pulse: 73 75  Weight: 159 lb (72.1 kg)   Body mass index is 27.29 kg/m.           Physical Examination:   General appearance: alert, well appearing, and in no distress  Mental status: alert, oriented to person, place, and time  Skin: warm & dry   Extremities: Edema: None    Cardiovascular: normal heart rate noted  Respiratory: normal respiratory effort, no distress  Abdomen: gravid, soft, non-tender  Pelvic: Cervical exam deferred         Fetal Status:     Movement: Present    Fetal Surveillance Testing today: US  27+6 wks,cephalic,posterior placenta gr 0,normal ovaries,FHR 148 bpm,AFI 11 cm,EFW 1215 g 57%   Chaperone: N/A  Results for orders placed or performed in visit on 04/19/24 (from the past 24 hours)  POC Urinalysis Dipstick OB   Collection Time: 04/19/24 11:24 AM  Result Value Ref Range   Color, UA     Clarity, UA     Glucose, UA Negative Negative   Bilirubin, UA  Ketones, UA negative    Spec Grav, UA     Blood, UA negative    pH, UA     POC,PROTEIN,UA Small (1+) Negative, Trace, Small (1+), Moderate (2+), Large (3+), 4+   Urobilinogen, UA     Nitrite, UA negative    Leukocytes, UA Large (3+) (A) Negative   Appearance     Odor      Assessment & Plan:  High-risk pregnancy: [id number] at [redacted]w[redacted]d with an Estimated Date of Delivery: 07/13/24   1) CHTN, on labetalol  200mg  BID. Never started ASA, discussed importance, plans to pick up today. BP elevated today, 1+ proteinuria, asymptomatic. Didn't take labetalol  d/t pn2, will take as soon as gets home and schedule nurse bp check online for this afternoon (note routed to [place] to schedule). Reviewed pre-e s/s, reasons to seek care  2) +urine culture 3/3> can't tell that was treated, will repeat culture today. Currently asymptomatic  Meds: No orders of the defined types were placed in this  encounter.  Labs/procedures today: Tdap and PN2  Treatment Plan:   EFW q 4w     2x/wk testing nst/sono @ 32wks     Deliver 37-39.0wks (or as per MFM w/ poor control)____   Reviewed: Preterm labor symptoms and general obstetric precautions including but not limited to vaginal bleeding, contractions, leaking of fluid and fetal movement were reviewed in detail with the patient.  All questions were answered. Does have home bp cuff. Office bp cuff given: not applicable. Check bp weekly, let us  know if consistently >140 and/or >90.  Follow-up: Return for 2wk HROB MD/CNM; 4wks from now (32w) start BPP/dopp HROB and NST/nurse.   Future Appointments  Date Time Provider Department Center  04/19/2024  4:10 PM [facility] NURSE [facility]  05/03/2024  9:30 AM Ferd Householder, CNM [facility]     Orders Placed This Encounter  Procedures   Urine Culture   US  FETAL BPP WO NON STRESS   US  UA Cord Doppler   Tdap vaccine greater than or equal to 7yo IM   POC Urinalysis Dipstick OB   Ferd Householder CNM, [facility] 04/19/2024 11:31 AM

## 2024-04-20 LAB — GLUCOSE TOLERANCE, 2 HOURS W/ 1HR

## 2024-04-20 LAB — ANTIBODY SCREEN: Antibody Screen: NEGATIVE

## 2024-04-20 LAB — CBC
Hematocrit: 34.3 % (ref 34.0–46.6)
Hemoglobin: 11 g/dL — ABNORMAL LOW (ref 11.1–15.9)
MCH: 28.1 pg (ref 26.6–33.0)
MCHC: 32.1 g/dL (ref 31.5–35.7)
MCV: 88 fL (ref 79–97)
Platelets: 281 10*3/uL (ref 150–450)
RBC: 3.91 x10E6/uL (ref 3.77–5.28)
RDW: 12.9 % (ref 11.7–15.4)
WBC: 12.8 10*3/uL — ABNORMAL HIGH (ref 3.4–10.8)

## 2024-04-20 LAB — HIV ANTIBODY (ROUTINE TESTING W REFLEX): HIV Screen 4th Generation wRfx: NONREACTIVE

## 2024-04-20 LAB — RPR: RPR Ser Ql: NONREACTIVE

## 2024-04-24 ENCOUNTER — Ambulatory Visit: Payer: Self-pay | Admitting: Women's Health

## 2024-04-24 DIAGNOSIS — O99891 Other specified diseases and conditions complicating pregnancy: Secondary | ICD-10-CM | POA: Insufficient documentation

## 2024-04-24 LAB — URINE CULTURE

## 2024-04-24 MED ORDER — SULFAMETHOXAZOLE-TRIMETHOPRIM 800-160 MG PO TABS
1.0000 | ORAL_TABLET | Freq: Two times a day (BID) | ORAL | 0 refills | Status: DC
Start: 2024-04-24 — End: 2024-06-08

## 2024-05-03 ENCOUNTER — Encounter: Admitting: Advanced Practice Midwife

## 2024-05-18 ENCOUNTER — Ambulatory Visit: Admitting: Obstetrics & Gynecology

## 2024-05-18 ENCOUNTER — Other Ambulatory Visit

## 2024-05-18 VITALS — BP 124/82 | HR 85 | Wt 161.0 lb

## 2024-05-18 DIAGNOSIS — O0993 Supervision of high risk pregnancy, unspecified, third trimester: Secondary | ICD-10-CM

## 2024-05-18 DIAGNOSIS — O2643 Herpes gestationis, third trimester: Secondary | ICD-10-CM | POA: Diagnosis not present

## 2024-05-18 DIAGNOSIS — Z3A32 32 weeks gestation of pregnancy: Secondary | ICD-10-CM

## 2024-05-18 DIAGNOSIS — O10913 Unspecified pre-existing hypertension complicating pregnancy, third trimester: Secondary | ICD-10-CM | POA: Diagnosis not present

## 2024-05-18 DIAGNOSIS — Z87440 Personal history of urinary (tract) infections: Secondary | ICD-10-CM

## 2024-05-18 DIAGNOSIS — O0992 Supervision of high risk pregnancy, unspecified, second trimester: Secondary | ICD-10-CM

## 2024-05-18 DIAGNOSIS — I10 Essential (primary) hypertension: Secondary | ICD-10-CM | POA: Diagnosis not present

## 2024-05-18 DIAGNOSIS — O10013 Pre-existing essential hypertension complicating pregnancy, third trimester: Secondary | ICD-10-CM | POA: Diagnosis not present

## 2024-05-18 DIAGNOSIS — B009 Herpesviral infection, unspecified: Secondary | ICD-10-CM

## 2024-05-18 DIAGNOSIS — O10919 Unspecified pre-existing hypertension complicating pregnancy, unspecified trimester: Secondary | ICD-10-CM

## 2024-05-18 LAB — POCT URINALYSIS DIPSTICK OB
Blood, UA: NEGATIVE
Glucose, UA: NEGATIVE
Ketones, UA: NEGATIVE
Nitrite, UA: NEGATIVE

## 2024-05-18 NOTE — Progress Notes (Signed)
 HIGH-RISK PREGNANCY VISIT Patient name: Brooke Briggs MRN [id number]  Date of birth: Apr 12, 1991 Chief Complaint:   Routine Prenatal Visit  History of Present Illness:   Brooke Briggs is a 33 y.o. [id number] female at [redacted]w[redacted]d with an Estimated Date of Delivery: 07/13/24 being seen today for ongoing management of a high-risk pregnancy complicated by     ICD-10-CM   1. Supervision of high risk pregnancy in second trimester  O09.92     2. Chronic hypertension  I10     3. History of UTI  Z87.440 Urine Culture    POC Urinalysis Dipstick OB    4. 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    Today she reports no complaints. Contractions: Not present. Vag. Bleeding: None.  Movement: Present. denies leaking of fluid.      04/19/2024   10:18 AM 01/10/2024   11:26 AM 08/11/2021   11:13 AM 04/02/2021    9:48 AM 01/02/2020   11:17 AM  Depression screen PHQ 2/9  Decreased Interest 0 0 0 2 0  Down, Depressed, Hopeless 0 0 0 0 0  PHQ - 2 Score 0 0 0 2 0  Altered sleeping 0 0 0 2   Tired, decreased energy 0 0 0 0   Change in appetite 0 0 0 1   Feeling bad or failure about yourself  0 0 0 0   Trouble concentrating 0 0 0 0   Moving slowly or fidgety/restless 0 0 0 0   Suicidal thoughts 0 0 0 0   PHQ-9 Score 0 0 0 5         01/10/2024   11:26 AM 08/11/2021   11:13 AM 04/02/2021    9:50 AM  GAD 7 : Generalized Anxiety Score  Nervous, Anxious, on Edge 0 0 3  Control/stop worrying 0 0 2  Worry too much - different things 0 0 2  Trouble relaxing 0 0 2  Restless 0 0 0  Easily annoyed or irritable 0 0 2  Afraid - awful might happen 0 0 2  Total GAD 7 Score 0 0 13     Review of Systems:   Pertinent items are noted in HPI Denies abnormal vaginal discharge w/ itching/odor/irritation, headaches, visual changes, shortness of breath, chest pain, abdominal pain, severe nausea/vomiting, or problems with urination or  bowel movements unless otherwise stated above. Pertinent History Reviewed:  Reviewed past medical,surgical, social, obstetrical and family history.  Reviewed problem list, medications and allergies. Physical Assessment:   Vitals:   05/18/24 1004  BP: 124/82  Pulse: 85  Weight: 161 lb (73 kg)  Body mass index is 27.64 kg/m.           Physical Examination:   General appearance: alert, well appearing, and in no distress  Mental status: alert, oriented to person, place, and time  Skin: warm & dry   Extremities:      Cardiovascular: normal heart rate noted  Respiratory: normal respiratory effort, no distress  Abdomen: gravid, soft, non-tender  Pelvic: Cervical exam deferred         Fetal Status:     Movement: Present    Fetal Surveillance Testing today: BPP 8/8 UAD 77% EFW 48%   Chaperone: N/A    No results found for this or any previous visit (from the past 24 hours).  Assessment & Plan:  High-risk pregnancy: [id number]  at [redacted]w[redacted]d with an Estimated Date of Delivery: 07/13/24      ICD-10-CM   1. Supervision of high risk pregnancy in second trimester  O09.92     2. Chronic hypertension  I10     3. History of UTI  Z87.440 Urine Culture    POC Urinalysis Dipstick OB    4. 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   Orders:  Orders Placed This Encounter  Procedures   Urine Culture   POC Urinalysis Dipstick OB     Labs/procedures today: U/S  Treatment Plan:  twice weekly surveillance  Reviewed: Preterm labor symptoms and general obstetric precautions including but not limited to vaginal bleeding, contractions, leaking of fluid and fetal movement were reviewed in detail with the patient.  All questions were answered. Does have home bp cuff. Office bp cuff given: not applicable. Check bp daily, let us  know if  consistently >150 and/or >95.  Follow-up: Return for keep scheduled.   Future Appointments  Date Time Provider Department Center  05/22/2024 10:30 AM [facility] NURSE [facility]  05/25/2024  1:30 PM [facility] US  [facility] None  05/25/2024  2:30 PM Kizzie Suzen SAUNDERS, CNM [facility]  05/29/2024 10:50 AM [facility] NURSE [facility]  06/01/2024  1:30 PM [facility] US  [facility] None  06/01/2024  2:30 PM Marilynn Nest, DO [facility]  06/05/2024  9:30 AM [facility] NURSE [facility]  06/08/2024  1:30 PM [facility] US  [facility] None  06/08/2024  2:30 PM Kizzie Suzen SAUNDERS, CNM [facility]  06/12/2024  9:50 AM [facility] NURSE [facility]  06/15/2024  8:30 AM [facility] US  [facility] None  06/15/2024  9:30 AM Marilynn Nest, DO [facility]  06/19/2024 10:10 AM [facility] NURSE [facility]  06/22/2024 10:45 AM [facility] US  [facility] None  06/22/2024 11:30 AM Jayne Vonn DEL, MD [facility]  06/26/2024  9:30 AM [facility] NURSE [facility]  06/29/2024  8:30 AM [facility] US  [facility] None  06/29/2024  9:30 AM Kizzie Suzen SAUNDERS, CNM [facility]  07/03/2024  9:50 AM [facility] NURSE [facility]  07/06/2024  8:30 AM [facility] US  [facility] None  07/06/2024  9:30 AM Kizzie Suzen SAUNDERS, CNM [facility]  07/11/2024  9:50 AM [facility] NURSE [facility]    Orders Placed This Encounter  Procedures   Urine Culture   POC Urinalysis Dipstick OB   Vonn DEL Jayne  Attending Physician for the Center for [facility] 05/18/2024 10:37 AM

## 2024-05-18 NOTE — Progress Notes (Signed)
 US  32 wks,cephalic,posterior placenta gr 1,FHR 150 bpm,AFI 11 cm,BPP 8/8,EFW 1939 g 48%,RI .66,.69,.67=77%

## 2024-05-21 LAB — URINE CULTURE

## 2024-05-22 ENCOUNTER — Other Ambulatory Visit

## 2024-05-22 ENCOUNTER — Ambulatory Visit: Payer: Self-pay | Admitting: Obstetrics & Gynecology

## 2024-05-22 MED ORDER — CEPHALEXIN 500 MG PO CAPS: 500.0000 mg | ORAL_CAPSULE | Freq: Three times a day (TID) | ORAL | 0 refills | Status: DC

## 2024-05-25 ENCOUNTER — Other Ambulatory Visit

## 2024-05-25 ENCOUNTER — Encounter: Admitting: Women's Health

## 2024-05-29 ENCOUNTER — Other Ambulatory Visit

## 2024-06-01 ENCOUNTER — Encounter: Admitting: Obstetrics & Gynecology

## 2024-06-01 ENCOUNTER — Other Ambulatory Visit

## 2024-06-05 ENCOUNTER — Other Ambulatory Visit

## 2024-06-08 ENCOUNTER — Other Ambulatory Visit: Payer: Self-pay

## 2024-06-08 ENCOUNTER — Ambulatory Visit: Admitting: Women's Health

## 2024-06-08 ENCOUNTER — Inpatient Hospital Stay (HOSPITAL_COMMUNITY)
Admission: AD | Admit: 2024-06-08 | Discharge: 2024-06-08 | Disposition: A | Discharge status: Home / Self Care | Attending: Obstetrics & Gynecology | Admitting: Obstetrics & Gynecology

## 2024-06-08 ENCOUNTER — Encounter: Payer: Self-pay | Admitting: Women's Health

## 2024-06-08 ENCOUNTER — Ambulatory Visit

## 2024-06-08 ENCOUNTER — Encounter (HOSPITAL_COMMUNITY): Payer: Self-pay | Admitting: Obstetrics & Gynecology

## 2024-06-08 VITALS — BP 170/119 | HR 83

## 2024-06-08 DIAGNOSIS — I10 Essential (primary) hypertension: Secondary | ICD-10-CM | POA: Diagnosis not present

## 2024-06-08 DIAGNOSIS — Z3A35 35 weeks gestation of pregnancy: Secondary | ICD-10-CM | POA: Diagnosis not present

## 2024-06-08 DIAGNOSIS — O10919 Unspecified pre-existing hypertension complicating pregnancy, unspecified trimester: Secondary | ICD-10-CM

## 2024-06-08 DIAGNOSIS — O0993 Supervision of high risk pregnancy, unspecified, third trimester: Secondary | ICD-10-CM

## 2024-06-08 DIAGNOSIS — O0992 Supervision of high risk pregnancy, unspecified, second trimester: Secondary | ICD-10-CM

## 2024-06-08 DIAGNOSIS — O10913 Unspecified pre-existing hypertension complicating pregnancy, third trimester: Secondary | ICD-10-CM | POA: Insufficient documentation

## 2024-06-08 LAB — URINALYSIS, ROUTINE W REFLEX MICROSCOPIC
Bilirubin Urine: NEGATIVE
Glucose, UA: NEGATIVE mg/dL
Hgb urine dipstick: NEGATIVE
Ketones, ur: NEGATIVE mg/dL
Nitrite: NEGATIVE
Protein, ur: NEGATIVE mg/dL
Specific Gravity, Urine: 1.006 (ref 1.005–1.030)
pH: 7 (ref 5.0–8.0)

## 2024-06-08 LAB — POCT URINALYSIS DIPSTICK OB
Blood, UA: NEGATIVE
Glucose, UA: NEGATIVE
Ketones, UA: NEGATIVE
Nitrite, UA: NEGATIVE

## 2024-06-08 LAB — COMPREHENSIVE METABOLIC PANEL WITH GFR
ALT: 12 U/L (ref 0–44)
AST: 17 U/L (ref 15–41)
Albumin: 2.6 g/dL — ABNORMAL LOW (ref 3.5–5.0)
Alkaline Phosphatase: 130 U/L — ABNORMAL HIGH (ref 38–126)
Anion gap: 11 (ref 5–15)
BUN: <5 mg/dL — ABNORMAL LOW (ref 6–20)
CO2: 22 mmol/L (ref 22–32)
Calcium: 8.6 mg/dL — ABNORMAL LOW (ref 8.9–10.3)
Chloride: 101 mmol/L (ref 98–111)
Creatinine, Ser: 0.83 mg/dL (ref 0.44–1.00)
GFR, Estimated: >60 mL/min (ref >60)
Glucose, Bld: 72 mg/dL (ref 70–99)
Potassium: 3.9 mmol/L (ref 3.5–5.1)
Sodium: 134 mmol/L — ABNORMAL LOW (ref 135–145)
Total Bilirubin: 0.6 mg/dL (ref 0.0–1.2)
Total Protein: 6.3 g/dL — ABNORMAL LOW (ref 6.5–8.1)

## 2024-06-08 LAB — PROTEIN / CREATININE RATIO, URINE
Creatinine, Urine: 63 mg/dL
Protein Creatinine Ratio: 0.46 mg/mg{creat} — ABNORMAL HIGH (ref 0.00–0.15)
Total Protein, Urine: 29 mg/dL

## 2024-06-08 LAB — CBC
HCT: 31.6 % — ABNORMAL LOW (ref 36.0–46.0)
Hemoglobin: 10.5 g/dL — ABNORMAL LOW (ref 12.0–15.0)
MCH: 27.9 pg (ref 26.0–34.0)
MCHC: 33.2 g/dL (ref 30.0–36.0)
MCV: 83.8 fL (ref 80.0–100.0)
Platelets: 285 K/uL (ref 150–400)
RBC: 3.77 MIL/uL — ABNORMAL LOW (ref 3.87–5.11)
RDW: 13.4 % (ref 11.5–15.5)
WBC: 13.1 K/uL — ABNORMAL HIGH (ref 4.0–10.5)
nRBC: 0 % (ref 0.0–0.2)

## 2024-06-08 MED ORDER — LABETALOL HCL 100 MG PO TABS
200.0000 mg | ORAL_TABLET | Freq: Once | ORAL | Status: AC
  Administered 2024-06-08: 200 mg via ORAL
  Filled 2024-06-08: qty 2
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
## 2024-06-08 NOTE — Patient Instructions (Signed)
Ardean Larsen, thank you for choosing our office today! We appreciate the opportunity to meet your healthcare needs. You may receive a short survey by mail, e-mail, or through [employer]. If you are happy with your care we would appreciate if you could take just a few minutes to complete the survey questions. We read all of your comments and take your feedback very seriously. Thank you again for choosing our office.  Center for [name] Team at [facility]  [facility] at [facility] ([address] [place], [place] [address]) Entrance C, located off of [address] Free 24/7 valet parking   CLASSES: Go to [email or web address] to register for classes (childbirth, breastfeeding, waterbirth, infant CPR, daddy bootcamp, etc.)  Call the office [phone]) or go to [facility] if: You begin to have strong, frequent contractions Your water breaks.  Sometimes it is a big gush of fluid, sometimes it is just a trickle that keeps getting your panties wet or running down your legs You have vaginal bleeding.  It is normal to have a small amount of spotting if your cervix was checked.  You don't feel your baby moving like normal.  If you don't, get you something to eat and drink and lay down and focus on feeling your baby move.   If your baby is still not moving like normal, you should call the office or go to [facility]  Call the office [phone]) or go to [facility] hospital for these signs of pre-eclampsia: Severe headache that does not go away with Tylenol Visual changes- seeing spots, double, blurred vision Pain under your right breast or upper abdomen that does not go away with Tums or heartburn medicine Nausea and/or vomiting Severe swelling in your hands, feet, and face   Tdap Vaccine It is recommended that you get the Tdap vaccine during the third trimester of EACH pregnancy to help protect your baby from getting pertussis (whooping cough) 27-36 weeks is the BEST time to do  this so that you can pass the protection on to your baby. During pregnancy is better than after pregnancy, but if you are unable to get it during pregnancy it will be offered at the hospital.  You can get this vaccine with [place], at the health department, your family doctor, or some local pharmacies Everyone who will be around your baby should also be up-to-date on their vaccines before the baby comes. Adults (who are not pregnant) only need 1 dose of Tdap during adulthood.   [facility] Pediatricians/Family Doctors [facility]): [address] Colette Ribas, [phone]           [facility] Associates: [address], [phone]                [facility]): [address], [phone] (call to ask if accepting patients) [facility] Department: [address], [place], [phone]    [facility] Pediatricians/Family Doctors [facility]): [phone] [address], [phone] [facility]: [address] [place], [phone] [facility]: [address], [phone]  [facility] Doctors  [facility]): [phone] [facility]: [address] [phone]   [facility] Doctors [facility]: [address], [phone]  [facility] Family Doctors  [facility]: [phone], [phone]  Home Blood Pressure Monitoring for Patients   Your provider has recommended that you check your  blood pressure (BP) at least once a week at home. If you do not have a blood pressure cuff at home, one will be provided for you. Contact your provider if you have not received your monitor within 1 week.   Helpful Tips for Accurate Home Blood Pressure Checks  Don't smoke, exercise, or drink caffeine 30 minutes before checking your BP Use the restroom before checking your BP (a full bladder can raise your  pressure) Relax in a comfortable upright chair Feet on the ground Left arm resting comfortably on a flat surface at the level of your heart Legs uncrossed Back supported Sit quietly and don't talk Place the cuff on your bare arm Adjust snuggly, so that only two fingertips can fit between your skin and the top of the cuff Check 2 readings separated by at least one minute Keep a log of your BP readings For a visual, please reference this diagram: http://ccnc.care/bpdiagram  Provider Name: [facility]     Phone: [phone]  Zone 1: ALL CLEAR  Continue to monitor your symptoms:  BP reading is less than 140 (top number) or less than 90 (bottom number)  No right upper stomach pain No headaches or seeing spots No feeling nauseated or throwing up No swelling in face and hands  Zone 2: CAUTION Call your doctor's office for any of the following:  BP reading is greater than 140 (top number) or greater than 90 (bottom number)  Stomach pain under your ribs in the middle or right side Headaches or seeing spots Feeling nauseated or throwing up Swelling in face and hands  Zone 3: EMERGENCY  Seek immediate medical care if you have any of the following:  BP reading is greater than160 (top number) or greater than 110 (bottom number) Severe headaches not improving with Tylenol Serious difficulty catching your breath Any worsening symptoms from Zone 2  Preterm Labor and Birth Information  The normal length of a pregnancy is 39-41 weeks. Preterm labor is when labor starts before 37 completed weeks of pregnancy. What are the risk factors for preterm labor? Preterm labor is more likely to occur in women who: Have certain infections during pregnancy such as a bladder infection, sexually transmitted infection, or infection inside the uterus (chorioamnionitis). Have a shorter-than-normal cervix. Have gone into preterm labor before. Have had surgery on their cervix. Are younger than age 17  or older than age 35. Are African American. Are pregnant with twins or multiple babies (multiple gestation). Take street drugs or smoke while pregnant. Do not gain enough weight while pregnant. Became pregnant shortly after having been pregnant. What are the symptoms of preterm labor? Symptoms of preterm labor include: Cramps similar to those that can happen during a menstrual period. The cramps may happen with diarrhea. Pain in the abdomen or lower back. Regular uterine contractions that may feel like tightening of the abdomen. A feeling of increased pressure in the pelvis. Increased watery or bloody mucus discharge from the vagina. Water breaking (ruptured amniotic sac). Why is it important to recognize signs of preterm labor? It is important to recognize signs of preterm labor because babies who are born prematurely may not be fully developed. This can put them at an increased risk for: Long-term (chronic) heart and lung problems. Difficulty immediately after birth with regulating body systems, including blood sugar, body temperature, heart rate, and breathing rate. Bleeding in the brain. Cerebral palsy. Learning difficulties. Death. These risks are highest for babies who are born before 34 weeks   of pregnancy. How is preterm labor treated? Treatment depends on the length of your pregnancy, your condition, and the health of your baby. It may involve: Having a stitch (suture) placed in your cervix to prevent your cervix from opening too early (cerclage). Taking or being given medicines, such as: Hormone medicines. These may be given early in pregnancy to help support the pregnancy. Medicine to stop contractions. Medicines to help mature the baby's lungs. These may be prescribed if the risk of delivery is high. Medicines to prevent your baby from developing cerebral palsy. If the labor happens before 34 weeks of pregnancy, you may need to stay in the hospital. What should I do if I  think I am in preterm labor? If you think that you are going into preterm labor, call your health care provider right away. How can I prevent preterm labor in future pregnancies? To increase your chance of having a full-term pregnancy: Do not use any tobacco products, such as cigarettes, chewing tobacco, and e-cigarettes. If you need help quitting, ask your health care provider. Do not use street drugs or medicines that have not been prescribed to you during your pregnancy. Talk with your health care provider before taking any herbal supplements, even if you have been taking them regularly. Make sure you gain a healthy amount of weight during your pregnancy. Watch for infection. If you think that you might have an infection, get it checked right away. Make sure to tell your health care provider if you have gone into preterm labor before. This information is not intended to replace advice given to you by your health care provider. Make sure you discuss any questions you have with your health care provider. Document Revised: 02/17/2019 Document Reviewed: 03/18/2016 Elsevier Patient Education  2020 Elsevier Inc.   

## 2024-06-08 NOTE — MAU Note (Signed)
 Brooke Briggs is a 33 y.o. at [redacted]w[redacted]d here in MAU reporting: high BP being sent from office,170s spots in eyes- shooting stars. Some contractions. NO c/o SROM, vaginal bleeding, contractions, HA, chest pain. [name]  +FM   LMP: na Onset of complaint: 1330 Pain score: 4/10 contractions Vitals:   06/08/24 1547  BP: (!) 160/103  Pulse: 81  Resp: 16  Temp: 99 F (37.2 C)  SpO2: 100%     FHT: 150  Lab orders placed from triage: ua

## 2024-06-08 NOTE — Progress Notes (Signed)
 HIGH-RISK PREGNANCY VISIT Patient name: Siobhan Zaro MRN [id number]  Date of birth: 07/22/1991 Chief Complaint:   Routine Prenatal Visit  History of Present Illness:   Brooke Briggs is a 33 y.o. G75P3003 female at [redacted]w[redacted]d with an Estimated Date of Delivery: 07/13/24 being seen today for ongoing management of a high-risk pregnancy complicated by chronic hypertension currently on labetalol  200mg  BID.    Today she reports didn't take labetalol  this am. Seeing spots/floaters which is new for her, occasional headache, no ruq/epigastric pain. Some contractions.   Vag. Bleeding: None.  Movement: Present. denies leaking of fluid.      04/19/2024   10:18 AM 01/10/2024   11:26 AM 08/11/2021   11:13 AM 04/02/2021    9:48 AM 01/02/2020   11:17 AM  Depression screen PHQ 2/9  Decreased Interest 0 0 0 2 0  Down, Depressed, Hopeless 0 0 0 0 0  PHQ - 2 Score 0 0 0 2 0  Altered sleeping 0 0 0 2   Tired, decreased energy 0 0 0 0   Change in appetite 0 0 0 1   Feeling bad or failure about yourself  0 0 0 0   Trouble concentrating 0 0 0 0   Moving slowly or fidgety/restless 0 0 0 0   Suicidal thoughts 0 0 0 0   PHQ-9 Score 0 0 0 5         01/10/2024   11:26 AM 08/11/2021   11:13 AM 04/02/2021    9:50 AM  GAD 7 : Generalized Anxiety Score  Nervous, Anxious, on Edge 0 0 3  Control/stop worrying 0 0 2  Worry too much - different things 0 0 2  Trouble relaxing 0 0 2  Restless 0 0 0  Easily annoyed or irritable 0 0 2  Afraid - awful might happen 0 0 2  Total GAD 7 Score 0 0 13     Review of Systems:   Pertinent items are noted in HPI Denies abnormal vaginal discharge w/ itching/odor/irritation, headaches, visual changes, shortness of breath, chest pain, abdominal pain, severe nausea/vomiting, or problems with urination or bowel movements unless otherwise stated above. Pertinent History Reviewed:  Reviewed past medical,surgical, social, obstetrical and family history.  Reviewed problem list, medications  and allergies. Physical Assessment:   Vitals:   06/08/24 1402 06/08/24 1420  BP: (!) 164/108 (!) 170/119  Pulse: 83   There is no height or weight on file to calculate BMI.           Physical Examination:   General appearance: alert, well appearing, and in no distress  Mental status: alert, oriented to person, place, and time  Skin: warm & dry   Extremities:      Cardiovascular: normal heart rate noted  Respiratory: normal respiratory effort, no distress  Abdomen: gravid, soft, non-tender  Pelvic: Cervical exam deferred         Fetal Status:     Movement: Present    Fetal Surveillance Testing today: US  35 wks,cephalic,FHR 138 bpm,AFI 16 cm,RI .65,.71,.65=79%,BPP 8/8,posterior placenta gr 2   Chaperone: N/A  Results for orders placed or performed in visit on 06/08/24 (from the past 24 hours)  POC Urinalysis Dipstick OB   Collection Time: 06/08/24  2:24 PM  Result Value Ref Range   Color, UA     Clarity, UA     Glucose, UA Negative Negative   Bilirubin, UA     Ketones, UA neg    Spec Grav,  UA     Blood, UA neg    pH, UA     POC,PROTEIN,UA Small (1+) Negative, Trace, Small (1+), Moderate (2+), Large (3+), 4+   Urobilinogen, UA     Nitrite, UA neg    Leukocytes, UA Trace (A) Negative   Appearance     Odor      Assessment & Plan:  High-risk pregnancy: [id number] at [redacted]w[redacted]d with an Estimated Date of Delivery: 07/13/24   1) CHTN, on labetalol  200mg  bid, didn't take this am, now w/ severe range bp's and spots/floaters, 1+ protein, to MAU for further eval (note sent to providers)  2) Recent UTI, poc urine cx today  3) HSV> on valtrex   Meds: No orders of the defined types were placed in this encounter.  Labs/procedures today: U/S  Treatment Plan:  to MAU  Follow-up: Return for As scheduled.   Future Appointments  Date Time Provider Department Center  06/08/2024  2:30 PM Kizzie Suzen SAUNDERS, [place] [facility]  06/12/2024  9:50 AM [facility] NURSE [facility]  06/15/2024   8:30 AM [facility] US  [facility] None  06/15/2024  9:30 AM Marilynn Nest, DO [facility]  06/19/2024 10:10 AM [facility] NURSE [facility]  06/22/2024 10:45 AM [facility] US  [facility] None  06/22/2024 11:30 AM Jayne Vonn DEL, MD [facility]  06/26/2024  9:30 AM [facility] NURSE [facility]  06/29/2024  8:30 AM [facility] US  [facility] None  06/29/2024  9:30 AM Kizzie Suzen SAUNDERS, CNM [facility]  07/03/2024  9:50 AM [facility] NURSE [facility]  07/06/2024  8:30 AM [facility] US  [facility] None  07/06/2024  9:30 AM Kizzie Suzen SAUNDERS, CNM [facility]  07/11/2024  9:50 AM [facility] NURSE [facility]    Orders Placed This Encounter  Procedures   Urine Culture   POC Urinalysis Dipstick OB   Suzen SAUNDERS Kizzie CNM, [facility] 06/08/2024 2:27 PM

## 2024-06-08 NOTE — Progress Notes (Signed)
 US  35 wks,cephalic,FHR 138 bpm,AFI 16 cm,RI .65,.71,.65=79%,BPP 8/8,posterior placenta gr 2

## 2024-06-10 LAB — URINE CULTURE

## 2024-06-12 ENCOUNTER — Other Ambulatory Visit

## 2024-06-12 ENCOUNTER — Ambulatory Visit: Payer: Self-pay | Admitting: Adult Health

## 2024-06-15 ENCOUNTER — Ambulatory Visit: Admitting: Obstetrics & Gynecology

## 2024-06-15 ENCOUNTER — Other Ambulatory Visit (HOSPITAL_COMMUNITY)
Admission: RE | Admit: 2024-06-15 | Discharge: 2024-06-15 | Disposition: A | Discharge status: Home / Self Care | Source: Ambulatory Visit | Attending: Obstetrics & Gynecology | Admitting: Obstetrics & Gynecology

## 2024-06-15 ENCOUNTER — Encounter: Payer: Self-pay | Admitting: Obstetrics & Gynecology

## 2024-06-15 ENCOUNTER — Other Ambulatory Visit

## 2024-06-15 VITALS — BP 139/86 | HR 97 | Wt 165.0 lb

## 2024-06-15 DIAGNOSIS — O10013 Pre-existing essential hypertension complicating pregnancy, third trimester: Secondary | ICD-10-CM | POA: Diagnosis not present

## 2024-06-15 DIAGNOSIS — Z3A36 36 weeks gestation of pregnancy: Secondary | ICD-10-CM | POA: Insufficient documentation

## 2024-06-15 DIAGNOSIS — O0993 Supervision of high risk pregnancy, unspecified, third trimester: Secondary | ICD-10-CM

## 2024-06-15 DIAGNOSIS — O10919 Unspecified pre-existing hypertension complicating pregnancy, unspecified trimester: Secondary | ICD-10-CM

## 2024-06-15 DIAGNOSIS — O10913 Unspecified pre-existing hypertension complicating pregnancy, third trimester: Secondary | ICD-10-CM

## 2024-06-15 DIAGNOSIS — O98513 Other viral diseases complicating pregnancy, third trimester: Secondary | ICD-10-CM

## 2024-06-15 DIAGNOSIS — B009 Herpesviral infection, unspecified: Secondary | ICD-10-CM

## 2024-06-15 DIAGNOSIS — O0992 Supervision of high risk pregnancy, unspecified, second trimester: Secondary | ICD-10-CM

## 2024-06-15 DIAGNOSIS — I10 Essential (primary) hypertension: Secondary | ICD-10-CM

## 2024-06-15 NOTE — Progress Notes (Signed)
 US  36 wks,cephalic,BPP 8/8,RI .58,.54,.56=32%,posterior fundal placenta gr 3,AFI 20 cm,EFW 2664 g 35%

## 2024-06-15 NOTE — Progress Notes (Signed)
 HIGH-RISK PREGNANCY VISIT Patient name: Brooke Briggs No MRN [id number]  Date of birth: 11/17/1990 Chief Complaint:   Routine Prenatal Visit  History of Present Illness:   Brooke Briggs is a 33 y.o. [id number] female at [redacted]w[redacted]d with an Estimated Date of Delivery: 07/13/24 being seen today for ongoing management of a high-risk pregnancy complicated by:  Chronic HTN: Currently on Labetalol  200mg  bid She has noted some occasional floaters.  Denies blurry vision or change in her vision currently.  She has noted some intermittent right upper quadrant pain with movement.  Denies nausea or vomiting.  Denies headaches.  BP has been stable at home  Anxiety-doing well with BuSpar  HSV-asymptomatic, on Valtrex  suppression  She has also noted some green mucus-like discharge.  And thinks she may have lost her mucous plug.  She also notes occasional irregular contractions. Contractions: Irritability. Vag. Bleeding: None.  Movement: Present. denies leaking of fluid.      04/19/2024   10:18 AM 01/10/2024   11:26 AM 08/11/2021   11:13 AM 04/02/2021    9:48 AM 01/02/2020   11:17 AM  Depression screen PHQ 2/9  Decreased Interest 0 0 0 2 0  Down, Depressed, Hopeless 0 0 0 0 0  PHQ - 2 Score 0 0 0 2 0  Altered sleeping 0 0 0 2   Tired, decreased energy 0 0 0 0   Change in appetite 0 0 0 1   Feeling bad or failure about yourself  0 0 0 0   Trouble concentrating 0 0 0 0   Moving slowly or fidgety/restless 0 0 0 0   Suicidal thoughts 0 0 0 0   PHQ-9 Score 0 0 0 5      Current Outpatient Medications  Medication Instructions   aspirin  EC 162 mg, Oral, Daily   busPIRone  (BUSPAR ) 10 mg, 3 times daily   labetalol  (NORMODYNE ) 200 mg, Oral, 2 times daily   sertraline  (ZOLOFT ) 50 mg, Daily   valACYclovir  (VALTREX ) 500 mg, Oral, 2 times daily     Review of Systems:   Pertinent items are noted in HPI Denies shortness of breath, chest pain, severe nausea/vomiting, or problems with urination or bowel movements unless  otherwise stated above. Pertinent History Reviewed:  Reviewed past medical,surgical, social, obstetrical and family history.  Reviewed problem list, medications and allergies. Physical Assessment:   Vitals:   06/15/24 0919  BP: 139/86  Pulse: 97  Weight: 165 lb (74.8 kg)  Body mass index is 28.32 kg/m.           Physical Examination:   General appearance: alert, well appearing, and in no distress  Mental status: normal mood, behavior, speech, dress, motor activity, and thought processes  Skin: warm & dry   Extremities:  No edema   Cardiovascular: normal heart rate noted  Respiratory: normal respiratory effort, no distress  Abdomen: gravid, soft, non-tender, no RUQ pain noted  Pelvic: Cervical exam performed  Dilation: 3 Effacement (%): 50 Station: -2  Fetal Status:     Movement: Present    Fetal Surveillance Testing today: ,cephalic,BPP 8/8,RI .58,.54,.56=32%,posterior fundal placenta gr 3,AFI 20 cm,EFW 2664 g 35%    Chaperone: Aleck Blase    No results found for this or any previous visit (from the past 24 hours).   Assessment & Plan:  High-risk pregnancy: [id number] at [redacted]w[redacted]d with an Estimated Date of Delivery: 07/13/24   1. Supervision of high risk pregnancy in third trimester (Primary) - Cervicovaginal ancillary only - Culture, beta strep (  group b only)  3. Chronic hypertension affecting pregnancy -Lab work to be collected today due to patient reporting symptoms - Continue labetalol  200 mg twice daily - Normal antepartum testing today and will continue - Reviewed preeclampsia precautions - Will plan to schedule for induction within the 37 to 39-week window  IOL scheduled for daytime 8/15  4. 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   Labs/procedures today: growth, BPP, CBC, CMP, PC ration, GBS,  vaginitis panel  Treatment Plan:  as outlined above  Reviewed: Preterm labor symptoms and general obstetric precautions including but not limited to vaginal bleeding, contractions, leaking of fluid and fetal movement were reviewed in detail with the patient.  All questions were answered. Pt has home bp cuff. Check bp weekly, let us  know if >160/110  Follow-up: Return for twice weekly as scheduled.   Future Appointments  Date Time Provider Department Center  06/19/2024 10:10 AM [facility] NURSE [facility]  06/22/2024 10:45 AM [facility] US  [facility] None  06/22/2024 11:30 AM Jayne Vonn DEL, MD [facility]  06/23/2024  7:15 AM [facility] SCHED ROOM [facility] None  06/26/2024  9:30 AM [facility] NURSE [facility]  06/29/2024  8:30 AM [facility] US  [facility] None  06/29/2024  9:30 AM Kizzie Suzen SAUNDERS, CNM [facility]  07/03/2024  9:50 AM [facility] NURSE [facility]  07/06/2024  8:30 AM [facility] US  [facility] None  07/06/2024  9:30 AM Kizzie Suzen SAUNDERS, CNM [facility]  07/11/2024  9:50 AM [facility] NURSE [facility]    Orders Placed This Encounter  Procedures   Culture, beta strep (group b only)   Protein / creatinine ratio, urine   Comprehensive metabolic panel with GFR   CBC    Icyss Skog, DO Attending Obstetrician & Gynecologist, [facility] [name], [facility]

## 2024-06-16 ENCOUNTER — Telehealth (HOSPITAL_COMMUNITY): Payer: Self-pay | Admitting: *Deleted

## 2024-06-16 LAB — CERVICOVAGINAL ANCILLARY ONLY
Bacterial Vaginitis (gardnerella): NEGATIVE
Candida Glabrata: NEGATIVE
Candida Vaginitis: NEGATIVE
Chlamydia: NEGATIVE
Comment: NEGATIVE
Comment: NEGATIVE
Comment: NEGATIVE
Comment: NEGATIVE
Comment: NORMAL
Neisseria Gonorrhea: NEGATIVE

## 2024-06-16 LAB — PROTEIN / CREATININE RATIO, URINE
Creatinine, Urine: 108.8 mg/dL
Protein, Ur: 91.8 mg/dL
Protein/Creat Ratio: 844 mg/g{creat} — ABNORMAL HIGH (ref 0–200)

## 2024-06-16 NOTE — Telephone Encounter (Signed)
 Preadmission screen

## 2024-06-17 ENCOUNTER — Ambulatory Visit: Payer: Self-pay | Admitting: Obstetrics & Gynecology

## 2024-06-18 NOTE — Progress Notes (Signed)
 OB TRIAGE  [facility]     Clinical Impression    Diagnosis:  [redacted] weeks gestation Chronic Hypertension   Disposition: Home Follow-up: As scheduled this week for routine prenatal care   ED Course    Patient observed in OBED Triage.  Maternal and fetal status evaluations were reassuring. Patient received her home BP medications, LR bolus and anti-emetics. She refused other home medications.  Labs were consistent with recent labs from her last prenatal appointment. BP improved in OB triage. SVE unchanged from prenatal visit last week. FHT was initially tachycardic and improved following IVF bolus.  Strict precautions given re: indications to call or return. She was encourage to keep scheduled appointment and IOL as scheduled this week.  She was discharged home in stable condition.    Medications  busPIRone  (BUSPAR ) tablet 10 mg (10 mg Oral Refused 06/18/24 1658)  sertraline  (ZOLOFT ) tablet 100 mg (has no administration in time range)  lactated ringers  bolus 1,000 mL (0 mL Intravenous Stopped 06/18/24 1654)  labetalol  (NORMODYNE ) tablet 200 mg (200 mg Oral Given 06/18/24 1516)  ondansetron  (ZOFRAN ) injection 4 mg (4 mg Intravenous Given 06/18/24 1531)   Current Discharge Medication List     Current Discharge Medication List       History   Chief Complaint:   Chief Complaint  Patient presents with  . Labor Pain    Pt states that she started having pain around 0430 this am, pressure on her right side 4/10 that feels like baby is pushing on my bladder or something that he shouldn't be.     HPI:  Brooke Briggs is a 33 y.o. [phone] at [redacted]w[redacted]d with Estimated Date of Delivery: 07/14/24 here with c/o right abdominal pain.  States pain is greatest when fetus or she are moving.  Is unsure whether it is contractions.  Also has some concerns about a possible UTI.  Having occasional floaters.  Unsure whether she is having leaking of fluid.  Was 3 cm dilated this past week in  the office.  States that she had similar complaints at her prenatal appointment this past week, but labs were normal or neg for PreE. She is scheduled for an IOL on 8/15. Reports normal fetal movement.  Patient also states that she is ready to be done with this pregnancy.  Pregnancy is complicated by:  Problem List[1]   Prenatal Care Provider: [place] The patient's prenatal records and other relevant notes/imaging/labs from the chart were reviewed: yes Interpreter Services: no   Past Medical History   Home Medications Medications Ordered Prior to Encounter[2]   Allergies Allergies[3]  OB History  Gravida Para Term Preterm AB Living  4 3 3   3   SAB IAB Ectopic Molar Multiple Live Births       3    # Outcome Date GA Lbr Len/2nd Weight Sex Type Anes PTL Lv  4 Current           3 Term 10/22/21 [redacted]w[redacted]d 05:03 / 00:22 3232 g (7 lb 2 oz) M Vag-Spont None  LIV     Complications: Pre-eclampsia  2 Term 10/25/18 [redacted]w[redacted]d 01:32 / 00:11 3170 g (6 lb 15.8 oz) M Vag-Spont None  LIV  1 Term 01/02/16 [redacted]w[redacted]d  3430 g (7 lb 9 oz) M Vag-Spont  N LIV    Past Medical History[4]  Past Surgical History[5]  Social History   Substance and Sexual Activity  Alcohol Use Not Currently   Tobacco Use History[6]  Family  History[7]  ROS ROS completed and is otherwise negative except as listed above in the HPI.   Physical Exam   BP 142/82   Pulse 108   Temp 37.1 C (98.7 F) (Oral)   Resp 20   Ht 162.6 cm (5' 4)   Wt 70.8 kg (156 lb)   SpO2 100%   BMI 26.78 kg/m   Physical Exam Gen: AAO; NAD HEENT: NCAT Abd: Soft, nt, Gravid Neuro: CNs II-XI grossly intact Ext: No edema Mood/Affect:Anxious SVE: 3/50/-2-->unchanged from cervical exam last week in the office per notes  Provider Performed Testing NST Indication for NST: cHTN NST Results: Tachycardic; +accels, -decels, mod var-->normal baseline following IVF bolus FHR Category: Category I Toco: None Length of test: see GE  Connect  Results   Labs, imaging reviewed per chart.     Recent Results (from the past 24 hours)  Drug Screen, Urine   Collection Time: 06/18/24  2:25 PM  Result Value Ref Range   Amphetamines Screen, Ur Negative Negative   Barbiturates Screen, Ur Negative Negative   Benzodiazepines Screen, Urine Negative Negative   Cannabinoids Screen, Ur Positive (A) Negative   Cocaine(Metab.)Screen, Urine Negative Negative   Methadone Screen, Urine Negative Negative   Opiates Screen, Ur Negative Negative   Oxycodone  Screen, Ur Negative Negative  Urinalysis with Microscopy   Collection Time: 06/18/24  2:25 PM  Result Value Ref Range   Color, UA Light Yellow    Clarity, UA Clear Clear   Specific Gravity, UA 1.015 1.010 - 1.025   pH, UA 7.5 5.0 - 8.0   Leukocyte Esterase, UA Negative Negative   Nitrite, UA Negative Negative   Protein, UA 30 mg/dL (A) Negative   Glucose, UA Negative Negative, Trace   Ketones, UA Negative Negative   Urobilinogen, UA <2.0 mg/dL <7.9 mg/dL   Bilirubin, UA Negative Negative   Blood, UA Negative Negative   RBC, UA 7 (H) 0 - 3 /HPF   WBC, UA 6 (H) 0 - 3 /HPF   WBC Clumps None Seen None Seen /HPF   Squam Epithel, UA 1 0 - 10 /HPF   Yeast, UA None Seen None Seen /HPF   Bacteria, UA None Seen None Seen /HPF   Hyphal Yeast None Seen None Seen /HPF  Protein/Creatinine Ratio, Urine   Collection Time: 06/18/24  2:25 PM  Result Value Ref Range   Creat U 99.8 Undefined mg/dL   Protein, Ur 26.7 mg/dL   Protein/Creatinine Ratio, Urine 0.733 Undefined  Comprehensive Metabolic Panel   Collection Time: 06/18/24  3:18 PM  Result Value Ref Range   Sodium 133 (L) 135 - 145 mmol/L   Potassium 3.6 3.5 - 5.0 mmol/L   Chloride 97 (L) 98 - 107 mmol/L   CO2 23.0 21.0 - 32.0 mmol/L   Anion Gap 13 (H) 3 - 11 mmol/L   BUN 7 (L) 8 - 20 mg/dL   Creatinine 9.19 9.39 - 1.10 mg/dL   BUN/Creatinine Ratio 9    eGFR CKD-EPI (2021) Female >90 >=60 mL/min/1.6m2   Glucose 84 70 - 179  mg/dL   Calcium  9.0 8.5 - 10.1 mg/dL   Albumin 2.5 (L) 3.5 - 5.0 g/dL   Total Protein 6.9 6.0 - 8.0 g/dL   Total Bilirubin 0.6 0.3 - 1.2 mg/dL   AST 12 (L) 15 - 40 U/L   ALT 18 12 - 78 U/L   Alkaline Phosphatase 174 (H) 46 - 116 U/L  CBC w/ Differential   Collection Time: 06/18/24  3:18 PM  Result Value Ref Range   WBC 20.9 (H) 4.0 - 10.5 10*9/L   RBC 3.67 (L) 3.80 - 5.10 10*12/L   HGB 10.3 (L) 11.5 - 15.0 g/dL   HCT 70.1 (L) 65.9 - 55.9 %   MCV 81.2 80.0 - 98.0 fL   MCH 28.1 27.0 - 34.0 pg   MCHC 34.6 32.0 - 36.0 g/dL   RDW 86.1 88.4 - 85.4 %   MPV 11.7 (H) 7.4 - 10.4 fL   Platelet 250 140 - 415 10*9/L   Neutrophils % 86.0 %   Lymphocytes % 5.0 %   Monocytes % 7.7 %   Eosinophils % 0.0 %   Basophils % 0.1 %   Absolute Neutrophils 17.9 (H) 1.8 - 7.8 10*9/L   Absolute Lymphocytes 1.1 0.7 - 4.5 10*9/L   Absolute Monocytes 1.6 (H) 0.1 - 1.0 10*9/L   Absolute Eosinophils 0.0 0.0 - 0.4 10*9/L   Absolute Basophils 0.0 0.0 - 0.2 10*9/L      Prenatal Labs No results found for requested labs within last 360 days.  Reviewed from outside chart.   US :  Scan on 8/7:  2664  grams (35 %)  at 35.5 weeks     Electronically signed by: Burnard Hendricks Angela Bridgett, DO 06/18/24          [1] Patient Active Problem List Diagnosis  . [redacted] weeks gestation of pregnancy  . Chronic hypertension affecting pregnancy  [2] No current facility-administered medications on file prior to encounter.   Current Outpatient Medications on File Prior to Encounter  Medication Sig Dispense Refill  . busPIRone  (BUSPAR ) 10 MG tablet Take 1 tablet (10 mg total) by mouth Three (3) times a day.    . labetalol  (NORMODYNE ) 200 MG tablet Take 1 tablet (200 mg total) by mouth two (2) times a day.    . sertraline  (ZOLOFT ) 100 MG tablet Take 1 tablet (100 mg total) by mouth daily.    . valACYclovir  (VALTREX ) 500 MG tablet Take 1 tablet (500 mg total) by mouth two (2) times a day.    . aspirin  (ECOTRIN) 81  MG tablet Take 2 tablets (162 mg total) by mouth daily. (Patient not taking: Reported on 06/18/2024)    [3] Allergies Allergen Reactions  . Hydromorphone Nausea And Vomiting and Other (See Comments)  [4] Past Medical History: Diagnosis Date  . Anxiety   . Depression   . Herpes   [5] No past surgical history on file. [6] Social History Tobacco Use  Smoking Status Former  . Types: e-Cigarettes  . Passive exposure: Current  Smokeless Tobacco Never  [7] Family History Problem Relation Age of Onset  . Hypertension Mother   . Hypertension Father

## 2024-06-19 ENCOUNTER — Telehealth (HOSPITAL_COMMUNITY): Payer: Self-pay | Admitting: *Deleted

## 2024-06-19 ENCOUNTER — Inpatient Hospital Stay (HOSPITAL_COMMUNITY)

## 2024-06-19 ENCOUNTER — Other Ambulatory Visit: Payer: Self-pay

## 2024-06-19 ENCOUNTER — Inpatient Hospital Stay (HOSPITAL_COMMUNITY)
Admission: AD | Admit: 2024-06-19 | Discharge: 2024-06-22 | DRG: 805 | Disposition: A | Discharge status: Home / Self Care | Attending: Obstetrics & Gynecology | Admitting: Obstetrics & Gynecology

## 2024-06-19 ENCOUNTER — Other Ambulatory Visit

## 2024-06-19 ENCOUNTER — Encounter (HOSPITAL_COMMUNITY): Payer: Self-pay | Admitting: Obstetrics & Gynecology

## 2024-06-19 DIAGNOSIS — O99892 Other specified diseases and conditions complicating childbirth: Secondary | ICD-10-CM | POA: Diagnosis present

## 2024-06-19 DIAGNOSIS — O1092 Unspecified pre-existing hypertension complicating childbirth: Secondary | ICD-10-CM | POA: Diagnosis present

## 2024-06-19 DIAGNOSIS — A6000 Herpesviral infection of urogenital system, unspecified: Secondary | ICD-10-CM | POA: Diagnosis present

## 2024-06-19 DIAGNOSIS — O9081 Anemia of the puerperium: Secondary | ICD-10-CM | POA: Diagnosis not present

## 2024-06-19 DIAGNOSIS — I10 Essential (primary) hypertension: Secondary | ICD-10-CM | POA: Diagnosis present

## 2024-06-19 DIAGNOSIS — Z8249 Family history of ischemic heart disease and other diseases of the circulatory system: Secondary | ICD-10-CM

## 2024-06-19 DIAGNOSIS — Z560 Unemployment, unspecified: Secondary | ICD-10-CM

## 2024-06-19 DIAGNOSIS — O411230 Chorioamnionitis, third trimester, not applicable or unspecified: Principal | ICD-10-CM | POA: Diagnosis present

## 2024-06-19 DIAGNOSIS — Z3A36 36 weeks gestation of pregnancy: Secondary | ICD-10-CM

## 2024-06-19 DIAGNOSIS — Z148 Genetic carrier of other disease: Secondary | ICD-10-CM

## 2024-06-19 DIAGNOSIS — Z79899 Other long term (current) drug therapy: Secondary | ICD-10-CM

## 2024-06-19 DIAGNOSIS — O9832 Other infections with a predominantly sexual mode of transmission complicating childbirth: Secondary | ICD-10-CM | POA: Diagnosis present

## 2024-06-19 DIAGNOSIS — O119 Pre-existing hypertension with pre-eclampsia, unspecified trimester: Secondary | ICD-10-CM | POA: Diagnosis present

## 2024-06-19 DIAGNOSIS — O10919 Unspecified pre-existing hypertension complicating pregnancy, unspecified trimester: Secondary | ICD-10-CM | POA: Diagnosis present

## 2024-06-19 DIAGNOSIS — O114 Pre-existing hypertension with pre-eclampsia, complicating childbirth: Secondary | ICD-10-CM | POA: Diagnosis present

## 2024-06-19 DIAGNOSIS — N179 Acute kidney failure, unspecified: Secondary | ICD-10-CM | POA: Diagnosis present

## 2024-06-19 DIAGNOSIS — D62 Acute posthemorrhagic anemia: Secondary | ICD-10-CM | POA: Diagnosis not present

## 2024-06-19 DIAGNOSIS — O42013 Preterm premature rupture of membranes, onset of labor within 24 hours of rupture, third trimester: Secondary | ICD-10-CM

## 2024-06-19 DIAGNOSIS — O0993 Supervision of high risk pregnancy, unspecified, third trimester: Secondary | ICD-10-CM

## 2024-06-19 DIAGNOSIS — O42913 Preterm premature rupture of membranes, unspecified as to length of time between rupture and onset of labor, third trimester: Principal | ICD-10-CM | POA: Diagnosis present

## 2024-06-19 DIAGNOSIS — R7989 Other specified abnormal findings of blood chemistry: Secondary | ICD-10-CM | POA: Diagnosis present

## 2024-06-19 DIAGNOSIS — A419 Sepsis, unspecified organism: Secondary | ICD-10-CM | POA: Diagnosis present

## 2024-06-19 LAB — URINALYSIS, ROUTINE W REFLEX MICROSCOPIC
Bilirubin Urine: NEGATIVE
Glucose, UA: NEGATIVE mg/dL
Ketones, ur: NEGATIVE mg/dL
Nitrite: NEGATIVE
Protein, ur: 100 mg/dL — AB
Specific Gravity, Urine: 1.005 (ref 1.005–1.030)
pH: 7 (ref 5.0–8.0)

## 2024-06-19 LAB — CULTURE, BETA STREP (GROUP B ONLY)

## 2024-06-19 MED ORDER — LACTATED RINGERS IV SOLN
150.0000 mL/h | INTRAVENOUS | Status: AC
  Administered 2024-06-20 (×4): 150 mL/h via INTRAVENOUS

## 2024-06-19 MED ORDER — LACTATED RINGERS IV BOLUS (SEPSIS)
1000.0000 mL | Freq: Once | INTRAVENOUS | Status: AC
  Administered 2024-06-20 (×2): 1000 mL via INTRAVENOUS

## 2024-06-19 MED ORDER — SODIUM CHLORIDE 0.9 % IV SOLN: 2.0000 g | INTRAVENOUS | Status: DC

## 2024-06-19 MED ORDER — LACTATED RINGERS IV BOLUS (SEPSIS)
250.0000 mL | Freq: Once | INTRAVENOUS | Status: AC
  Administered 2024-06-20 (×2): 250 mL via INTRAVENOUS

## 2024-06-19 MED ORDER — METRONIDAZOLE 500 MG/100ML IV SOLN: 500.0000 mg | Freq: Two times a day (BID) | INTRAVENOUS | Status: DC

## 2024-06-19 MED ORDER — SODIUM CHLORIDE 0.9 % IV SOLN
2.0000 g | Freq: Four times a day (QID) | INTRAVENOUS | Status: DC
  Administered 2024-06-19 – 2024-06-20 (×6): 2 g via INTRAVENOUS
  Filled 2024-06-19 (×3): qty 2000

## 2024-06-19 MED ORDER — GENTAMICIN SULFATE 40 MG/ML IJ SOLN
5.0000 mg/kg | INTRAVENOUS | Status: DC
  Administered 2024-06-20 (×2): 310 mg via INTRAVENOUS
  Filled 2024-06-19 (×2): qty 7.75

## 2024-06-19 MED ORDER — LACTATED RINGERS IV BOLUS (SEPSIS)
1000.0000 mL | Freq: Once | INTRAVENOUS | Status: AC
  Administered 2024-06-19 (×2): 1000 mL via INTRAVENOUS

## 2024-06-19 NOTE — MAU Note (Signed)
  MAU Triage Note:  .Brooke Briggs is a 33 y.o. at [redacted]w[redacted]d here in MAU reporting: came in for possible ROM around 2030 after noticing a trickle of gray, watery fluid. Also having contractions every 5 minutes. Last SVE yesterday was 3/50. Also reporting headache, chills, and body aches since last night. Denies VB. Reports DFM earlier today, but returned to normal after drinking pedialyte.  Patient complaint: WATER BROKE  Pain Score: 3  Pain Location: Abdomen     Onset of complaint: Last night LMP: No LMP recorded (lmp unknown). Patient is pregnant.  Vitals:   06/19/24 2220  BP: (!) 140/94  Pulse: (!) 115  Resp: 16  Temp: (!) 101.3 F (38.5 C)  SpO2: 98%    FHT:  Fetal Heart Rate Mode: Doppler Baseline Rate (A): 175 bpm Lab orders placed from triage: UA

## 2024-06-19 NOTE — Telephone Encounter (Signed)
 Preadmission screen

## 2024-06-19 NOTE — Sepsis Progress Note (Signed)
 Following for sepsis monitoring ?

## 2024-06-20 ENCOUNTER — Other Ambulatory Visit: Payer: Self-pay

## 2024-06-20 ENCOUNTER — Encounter (HOSPITAL_COMMUNITY): Payer: Self-pay | Admitting: Obstetrics & Gynecology

## 2024-06-20 DIAGNOSIS — O99892 Other specified diseases and conditions complicating childbirth: Secondary | ICD-10-CM | POA: Diagnosis present

## 2024-06-20 DIAGNOSIS — O9081 Anemia of the puerperium: Secondary | ICD-10-CM | POA: Diagnosis not present

## 2024-06-20 DIAGNOSIS — D62 Acute posthemorrhagic anemia: Secondary | ICD-10-CM | POA: Diagnosis not present

## 2024-06-20 DIAGNOSIS — Z560 Unemployment, unspecified: Secondary | ICD-10-CM | POA: Diagnosis not present

## 2024-06-20 DIAGNOSIS — Z148 Genetic carrier of other disease: Secondary | ICD-10-CM | POA: Diagnosis not present

## 2024-06-20 DIAGNOSIS — O9832 Other infections with a predominantly sexual mode of transmission complicating childbirth: Secondary | ICD-10-CM | POA: Diagnosis present

## 2024-06-20 DIAGNOSIS — Z3A36 36 weeks gestation of pregnancy: Secondary | ICD-10-CM | POA: Diagnosis not present

## 2024-06-20 DIAGNOSIS — O42913 Preterm premature rupture of membranes, unspecified as to length of time between rupture and onset of labor, third trimester: Secondary | ICD-10-CM | POA: Diagnosis present

## 2024-06-20 DIAGNOSIS — O114 Pre-existing hypertension with pre-eclampsia, complicating childbirth: Secondary | ICD-10-CM | POA: Diagnosis present

## 2024-06-20 DIAGNOSIS — A419 Sepsis, unspecified organism: Secondary | ICD-10-CM | POA: Diagnosis present

## 2024-06-20 DIAGNOSIS — Z8249 Family history of ischemic heart disease and other diseases of the circulatory system: Secondary | ICD-10-CM | POA: Diagnosis not present

## 2024-06-20 DIAGNOSIS — O1092 Unspecified pre-existing hypertension complicating childbirth: Secondary | ICD-10-CM | POA: Diagnosis present

## 2024-06-20 DIAGNOSIS — O411230 Chorioamnionitis, third trimester, not applicable or unspecified: Secondary | ICD-10-CM | POA: Diagnosis present

## 2024-06-20 DIAGNOSIS — N179 Acute kidney failure, unspecified: Secondary | ICD-10-CM | POA: Diagnosis present

## 2024-06-20 DIAGNOSIS — O10919 Unspecified pre-existing hypertension complicating pregnancy, unspecified trimester: Secondary | ICD-10-CM | POA: Diagnosis present

## 2024-06-20 DIAGNOSIS — A6000 Herpesviral infection of urogenital system, unspecified: Secondary | ICD-10-CM | POA: Diagnosis present

## 2024-06-20 DIAGNOSIS — O1414 Severe pre-eclampsia complicating childbirth: Secondary | ICD-10-CM | POA: Diagnosis not present

## 2024-06-20 DIAGNOSIS — O26893 Other specified pregnancy related conditions, third trimester: Secondary | ICD-10-CM | POA: Diagnosis present

## 2024-06-20 DIAGNOSIS — Z79899 Other long term (current) drug therapy: Secondary | ICD-10-CM | POA: Diagnosis not present

## 2024-06-20 LAB — COMPREHENSIVE METABOLIC PANEL WITH GFR
ALT: 16 U/L (ref 0–44)
AST: 23 U/L (ref 15–41)
Albumin: 2.6 g/dL — ABNORMAL LOW (ref 3.5–5.0)
Alkaline Phosphatase: 179 U/L — ABNORMAL HIGH (ref 38–126)
Anion gap: 12 (ref 5–15)
BUN: <5 mg/dL — ABNORMAL LOW (ref 6–20)
CO2: 21 mmol/L — ABNORMAL LOW (ref 22–32)
Calcium: 8.8 mg/dL — ABNORMAL LOW (ref 8.9–10.3)
Chloride: 98 mmol/L (ref 98–111)
Creatinine, Ser: 1.02 mg/dL — ABNORMAL HIGH (ref 0.44–1.00)
GFR, Estimated: >60 mL/min (ref >60)
Glucose, Bld: 75 mg/dL (ref 70–99)
Potassium: 3.4 mmol/L — ABNORMAL LOW (ref 3.5–5.1)
Sodium: 131 mmol/L — ABNORMAL LOW (ref 135–145)
Total Bilirubin: 1.3 mg/dL — ABNORMAL HIGH (ref 0.0–1.2)
Total Protein: 7 g/dL (ref 6.5–8.1)

## 2024-06-20 LAB — BLOOD CULTURE ID PANEL (REFLEXED) - BCID2

## 2024-06-20 LAB — CBC WITH DIFFERENTIAL/PLATELET
Abs Immature Granulocytes: 0.55 K/uL — ABNORMAL HIGH (ref 0.00–0.07)
Basophils Absolute: 0 K/uL (ref 0.0–0.1)
Basophils Relative: 0 %
Eosinophils Absolute: 0 K/uL (ref 0.0–0.5)
Eosinophils Relative: 0 %
HCT: 32.9 % — ABNORMAL LOW (ref 36.0–46.0)
Hemoglobin: 10.8 g/dL — ABNORMAL LOW (ref 12.0–15.0)
Immature Granulocytes: 3 %
Lymphocytes Relative: 5 %
Lymphs Abs: 1.1 K/uL (ref 0.7–4.0)
MCH: 27.9 pg (ref 26.0–34.0)
MCHC: 32.8 g/dL (ref 30.0–36.0)
MCV: 85 fL (ref 80.0–100.0)
Monocytes Absolute: 1.5 K/uL — ABNORMAL HIGH (ref 0.1–1.0)
Monocytes Relative: 7 %
Neutro Abs: 18 K/uL — ABNORMAL HIGH (ref 1.7–7.7)
Neutrophils Relative %: 85 %
Platelets: 199 K/uL (ref 150–400)
RBC: 3.87 MIL/uL (ref 3.87–5.11)
RDW: 14.2 % (ref 11.5–15.5)
WBC: 21.2 K/uL — ABNORMAL HIGH (ref 4.0–10.5)
nRBC: 0 % (ref 0.0–0.2)

## 2024-06-20 LAB — RESPIRATORY PANEL BY PCR

## 2024-06-20 LAB — TYPE AND SCREEN
ABO/RH(D): A POS
Antibody Screen: NEGATIVE

## 2024-06-20 LAB — WET PREP, GENITAL
Clue Cells Wet Prep HPF POC: NONE SEEN
Sperm: NONE SEEN
Trich, Wet Prep: NONE SEEN
WBC, Wet Prep HPF POC: >=10 — AB (ref <10)
Yeast Wet Prep HPF POC: NONE SEEN

## 2024-06-20 LAB — GC/CHLAMYDIA PROBE AMP (~~LOC~~) NOT AT ARMC
Chlamydia: NEGATIVE
Comment: NEGATIVE
Comment: NORMAL
Neisseria Gonorrhea: NEGATIVE

## 2024-06-20 LAB — CBC
HCT: 28.8 % — ABNORMAL LOW (ref 36.0–46.0)
Hemoglobin: 9.7 g/dL — ABNORMAL LOW (ref 12.0–15.0)
MCH: 28.5 pg (ref 26.0–34.0)
MCHC: 33.7 g/dL (ref 30.0–36.0)
MCV: 84.7 fL (ref 80.0–100.0)
Platelets: 219 K/uL (ref 150–400)
RBC: 3.4 MIL/uL — ABNORMAL LOW (ref 3.87–5.11)
RDW: 14.4 % (ref 11.5–15.5)
WBC: 18.8 K/uL — ABNORMAL HIGH (ref 4.0–10.5)
nRBC: 0 % (ref 0.0–0.2)

## 2024-06-20 LAB — PROTIME-INR
INR: 1.2 (ref 0.8–1.2)
Prothrombin Time: 15.7 s — ABNORMAL HIGH (ref 11.4–15.2)

## 2024-06-20 LAB — RESP PANEL BY RT-PCR (RSV, FLU A&B, COVID)  RVPGX2
Influenza A by PCR: NEGATIVE
Influenza B by PCR: NEGATIVE
Resp Syncytial Virus by PCR: NEGATIVE
SARS Coronavirus 2 by RT PCR: NEGATIVE

## 2024-06-20 LAB — PROTEIN / CREATININE RATIO, URINE
Creatinine, Urine: 80 mg/dL
Protein Creatinine Ratio: 1.18 mg/mg{creat} — ABNORMAL HIGH (ref 0.00–0.15)
Total Protein, Urine: 94 mg/dL

## 2024-06-20 LAB — BRAIN NATRIURETIC PEPTIDE: B Natriuretic Peptide: 110.5 pg/mL — ABNORMAL HIGH (ref 0.0–100.0)

## 2024-06-20 LAB — APTT: aPTT: 24 s (ref 24–36)

## 2024-06-20 LAB — POCT FERN TEST: POCT Fern Test: NEGATIVE

## 2024-06-20 LAB — LACTIC ACID, PLASMA: Lactic Acid, Venous: 1.9 mmol/L (ref 0.5–1.9)

## 2024-06-20 LAB — RPR: RPR Ser Ql: NONREACTIVE

## 2024-06-20 MED ORDER — SERTRALINE HCL 50 MG PO TABS
50.0000 mg | ORAL_TABLET | Freq: Every day | ORAL | Status: DC
  Administered 2024-06-21 – 2024-06-22 (×3): 50 mg via ORAL
  Filled 2024-06-20 (×2): qty 1

## 2024-06-20 MED ORDER — IBUPROFEN 600 MG PO TABS
600.0000 mg | ORAL_TABLET | Freq: Four times a day (QID) | ORAL | Status: DC
  Administered 2024-06-20 – 2024-06-22 (×13): 600 mg via ORAL
  Filled 2024-06-20 (×8): qty 1

## 2024-06-20 MED ORDER — FENTANYL CITRATE (PF) 100 MCG/2ML IJ SOLN
50.0000 ug | INTRAMUSCULAR | Status: DC | PRN
  Administered 2024-06-20 (×6): 100 ug via INTRAVENOUS
  Filled 2024-06-20 (×4): qty 2

## 2024-06-20 MED ORDER — ACETAMINOPHEN 325 MG PO TABS: 650.0000 mg | ORAL_TABLET | ORAL | Status: DC | PRN

## 2024-06-20 MED ORDER — LIDOCAINE HCL (PF) 1 % IJ SOLN: 30.0000 mL | INTRAMUSCULAR | Status: DC | PRN

## 2024-06-20 MED ORDER — OXYTOCIN BOLUS FROM INFUSION
333.0000 mL | Freq: Once | INTRAVENOUS | Status: AC
  Administered 2024-06-20 (×2): 333 mL via INTRAVENOUS

## 2024-06-20 MED ORDER — OXYTOCIN-SODIUM CHLORIDE 30-0.9 UT/500ML-% IV SOLN
1.0000 m[IU]/min | INTRAVENOUS | Status: DC
  Administered 2024-06-20 (×2): 2 m[IU]/min via INTRAVENOUS
  Filled 2024-06-20: qty 500

## 2024-06-20 MED ORDER — CALCIUM CARBONATE ANTACID 500 MG PO CHEW
2.0000 | CHEWABLE_TABLET | ORAL | Status: DC | PRN
  Administered 2024-06-20 (×2): 400 mg via ORAL
  Filled 2024-06-20: qty 2

## 2024-06-20 MED ORDER — PRENATAL MULTIVITAMIN CH
1.0000 | ORAL_TABLET | Freq: Every day | ORAL | Status: DC
  Administered 2024-06-21 – 2024-06-22 (×3): 1 via ORAL
  Filled 2024-06-20 (×2): qty 1

## 2024-06-20 MED ORDER — CALCIUM CARBONATE ANTACID 500 MG PO CHEW: 1.0000 | CHEWABLE_TABLET | ORAL | Status: DC | PRN

## 2024-06-20 MED ORDER — TETANUS-DIPHTH-ACELL PERTUSSIS 5-2.5-18.5 LF-MCG/0.5 IM SUSY: 0.5000 mL | PREFILLED_SYRINGE | Freq: Once | INTRAMUSCULAR | Status: DC

## 2024-06-20 MED ORDER — ONDANSETRON HCL 4 MG/2ML IJ SOLN
4.0000 mg | Freq: Four times a day (QID) | INTRAMUSCULAR | Status: DC | PRN
  Administered 2024-06-20 (×2): 4 mg via INTRAVENOUS
  Filled 2024-06-20: qty 2

## 2024-06-20 MED ORDER — SODIUM CHLORIDE 0.9 % IV SOLN
2.0000 g | INTRAVENOUS | Status: DC
  Administered 2024-06-20 – 2024-06-21 (×4): 2 g via INTRAVENOUS
  Filled 2024-06-20 (×3): qty 20

## 2024-06-20 MED ORDER — OXYCODONE-ACETAMINOPHEN 5-325 MG PO TABS: 1.0000 | ORAL_TABLET | ORAL | Status: DC | PRN

## 2024-06-20 MED ORDER — SOD CITRATE-CITRIC ACID 500-334 MG/5ML PO SOLN: 30.0000 mL | ORAL | Status: DC | PRN

## 2024-06-20 MED ORDER — SIMETHICONE 80 MG PO CHEW
80.0000 mg | CHEWABLE_TABLET | ORAL | Status: DC | PRN
Start: 2024-06-20 — End: 2024-06-23

## 2024-06-20 MED ORDER — SENNOSIDES-DOCUSATE SODIUM 8.6-50 MG PO TABS
2.0000 | ORAL_TABLET | ORAL | Status: DC
  Administered 2024-06-21 – 2024-06-22 (×3): 2 via ORAL
  Filled 2024-06-20 (×2): qty 2

## 2024-06-20 MED ORDER — DIBUCAINE (PERIANAL) 1 % EX OINT: 1.0000 | TOPICAL_OINTMENT | CUTANEOUS | Status: DC | PRN

## 2024-06-20 MED ORDER — COCONUT OIL OIL: 1.0000 | TOPICAL_OIL | Status: DC | PRN

## 2024-06-20 MED ORDER — BENZOCAINE-MENTHOL 20-0.5 % EX AERO
1.0000 | INHALATION_SPRAY | CUTANEOUS | Status: DC | PRN
Start: 2024-06-20 — End: 2024-06-23
  Administered 2024-06-21 (×2): 1 via TOPICAL
  Filled 2024-06-20: qty 56

## 2024-06-20 MED ORDER — WITCH HAZEL-GLYCERIN EX PADS
1.0000 | MEDICATED_PAD | CUTANEOUS | Status: DC | PRN
  Administered 2024-06-21 (×2): 1 via TOPICAL

## 2024-06-20 MED ORDER — FUROSEMIDE 20 MG PO TABS
20.0000 mg | ORAL_TABLET | Freq: Every day | ORAL | Status: DC
  Administered 2024-06-21 – 2024-06-22 (×3): 20 mg via ORAL
  Filled 2024-06-20 (×2): qty 1

## 2024-06-20 MED ORDER — CEFAZOLIN SODIUM-DEXTROSE 2-4 GM/100ML-% IV SOLN
2.0000 g | Freq: Three times a day (TID) | INTRAVENOUS | Status: DC
  Filled 2024-06-20 (×2): qty 100

## 2024-06-20 MED ORDER — BUSPIRONE HCL 5 MG PO TABS
10.0000 mg | ORAL_TABLET | Freq: Three times a day (TID) | ORAL | Status: DC
  Administered 2024-06-21 – 2024-06-22 (×8): 10 mg via ORAL
  Filled 2024-06-20 (×5): qty 2

## 2024-06-20 MED ORDER — ONDANSETRON HCL 4 MG/2ML IJ SOLN: 4.0000 mg | INTRAMUSCULAR | Status: DC | PRN

## 2024-06-20 MED ORDER — OXYCODONE-ACETAMINOPHEN 5-325 MG PO TABS: 2.0000 | ORAL_TABLET | ORAL | Status: DC | PRN

## 2024-06-20 MED ORDER — LACTATED RINGERS IV SOLN: 500.0000 mL | INTRAVENOUS | Status: DC | PRN

## 2024-06-20 MED ORDER — FENTANYL CITRATE (PF) 100 MCG/2ML IJ SOLN
100.0000 ug | Freq: Once | INTRAMUSCULAR | Status: AC
  Administered 2024-06-20 (×2): 100 ug via INTRAVENOUS

## 2024-06-20 MED ORDER — LACTATED RINGERS IV SOLN: INTRAVENOUS | Status: DC

## 2024-06-20 MED ORDER — DIPHENHYDRAMINE HCL 25 MG PO CAPS: 25.0000 mg | ORAL_CAPSULE | Freq: Four times a day (QID) | ORAL | Status: DC | PRN

## 2024-06-20 MED ORDER — ONDANSETRON HCL 4 MG PO TABS: 4.0000 mg | ORAL_TABLET | ORAL | Status: DC | PRN

## 2024-06-20 MED ORDER — ZOLPIDEM TARTRATE 5 MG PO TABS: 5.0000 mg | ORAL_TABLET | Freq: Every evening | ORAL | Status: DC | PRN

## 2024-06-20 MED ORDER — ACETAMINOPHEN 500 MG PO TABS
1000.0000 mg | ORAL_TABLET | Freq: Once | ORAL | Status: AC
  Administered 2024-06-20 (×2): 1000 mg via ORAL
  Filled 2024-06-20: qty 2

## 2024-06-20 MED ORDER — OXYTOCIN-SODIUM CHLORIDE 30-0.9 UT/500ML-% IV SOLN
2.5000 [IU]/h | INTRAVENOUS | Status: DC
  Administered 2024-06-20 (×2): 2.5 [IU]/h via INTRAVENOUS

## 2024-06-20 MED ORDER — LABETALOL HCL 200 MG PO TABS
200.0000 mg | ORAL_TABLET | Freq: Two times a day (BID) | ORAL | Status: DC
  Administered 2024-06-20 – 2024-06-21 (×4): 200 mg via ORAL
  Filled 2024-06-20 (×2): qty 1

## 2024-06-20 MED ORDER — TERBUTALINE SULFATE 1 MG/ML IJ SOLN: 0.2500 mg | Freq: Once | INTRAMUSCULAR | Status: DC | PRN

## 2024-06-20 NOTE — Progress Notes (Signed)
 PHARMACY - PHYSICIAN COMMUNICATION CRITICAL VALUE ALERT - BLOOD CULTURE IDENTIFICATION (BCID)  Brooke Briggs is an 33 y.o. female who presented to [facility] on 06/19/2024 with a chief complaint of pregnancy at [redacted]w[redacted]d. S/P vaginal delivery on 8/12. Pt with significant history of Klebsiella UTI.  Assessment:  Pt admitted with sepsis 2/2 chorio vs pyelo with pregnancy at [redacted]w[redacted]d. (include suspected source if known)  Name of physician (or Provider) Contacted: Steffan Rover, MD  Current antibiotics: Received Gentamicin  (d/c'd due to AKI) and switched to Cefazolin  2 gram IV q8h due to h/o Klebsiella UTI.  Changes to prescribed antibiotics recommended:  Recommendations accepted by provider Change to Ceftriaxone  2 gram IV q24h  Results for orders placed or performed during the hospital encounter of 06/19/24  Blood Culture ID Panel (Reflexed) (Collected: 06/19/2024 11:48 PM)  Result Value Ref Range   Enterococcus faecalis NOT DETECTED NOT DETECTED   Enterococcus Faecium NOT DETECTED NOT DETECTED   Listeria monocytogenes NOT DETECTED NOT DETECTED   Staphylococcus species NOT DETECTED NOT DETECTED   Staphylococcus aureus (BCID) NOT DETECTED NOT DETECTED   Staphylococcus epidermidis NOT DETECTED NOT DETECTED   Staphylococcus lugdunensis NOT DETECTED NOT DETECTED   Streptococcus species NOT DETECTED NOT DETECTED   Streptococcus agalactiae NOT DETECTED NOT DETECTED   Streptococcus pneumoniae NOT DETECTED NOT DETECTED   Streptococcus pyogenes NOT DETECTED NOT DETECTED   A.calcoaceticus-baumannii NOT DETECTED NOT DETECTED   Bacteroides fragilis NOT DETECTED NOT DETECTED   Enterobacterales DETECTED (A) NOT DETECTED   Enterobacter cloacae complex NOT DETECTED NOT DETECTED   Escherichia coli NOT DETECTED NOT DETECTED   Klebsiella aerogenes NOT DETECTED NOT DETECTED   Klebsiella oxytoca NOT DETECTED NOT DETECTED   Klebsiella pneumoniae DETECTED (A) NOT DETECTED   Proteus species NOT DETECTED NOT  DETECTED   Salmonella species NOT DETECTED NOT DETECTED   Serratia marcescens NOT DETECTED NOT DETECTED   Haemophilus influenzae NOT DETECTED NOT DETECTED   Neisseria meningitidis NOT DETECTED NOT DETECTED   Pseudomonas aeruginosa NOT DETECTED NOT DETECTED   Stenotrophomonas maltophilia NOT DETECTED NOT DETECTED   Candida albicans NOT DETECTED NOT DETECTED   Candida auris NOT DETECTED NOT DETECTED   Candida glabrata NOT DETECTED NOT DETECTED   Candida krusei NOT DETECTED NOT DETECTED   Candida parapsilosis NOT DETECTED NOT DETECTED   Candida tropicalis NOT DETECTED NOT DETECTED   Cryptococcus neoformans/gattii NOT DETECTED NOT DETECTED   CTX-M ESBL NOT DETECTED NOT DETECTED   Carbapenem resistance IMP NOT DETECTED NOT DETECTED   Carbapenem resistance KPC NOT DETECTED NOT DETECTED   Carbapenem resistance NDM NOT DETECTED NOT DETECTED   Carbapenem resist OXA 48 LIKE NOT DETECTED NOT DETECTED   Carbapenem resistance VIM NOT DETECTED NOT DETECTED    [name] MATSU 06/20/2024  5:02 PM
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
## 2024-06-20 NOTE — MAU Provider Note (Addendum)
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 sonographic findings. 1.  [id number] Estimated Date of Delivery: 07/13/24 by serial sonographic evaluations 2.  Fetal sonographic surveillance findings: a). Normal fluid volume b). Normal antepartum fetal assessment with BPP 8/8 c). Normal  fetal Doppler ratios with consistent diastolic flow:  79% 3.  Normal general sonographic findings Recommend continued prenatal evaluations and care based on this sonogram and as clinically indicated from the patient's clinical course. Delon HERO Ozan 06/11/2024 9:03 PM    US  UA Cord Doppler Result Date: 06/11/2024 Table formatting from the original result was not included. Images from the original result were not included.  ..an [employer] [facility] [employer]trasound Medicine Technical sales engineer) accredited practice Center for [facility] [address] [place] [address] Ordering Provider: Kizzie Suzen SAUNDERS, CNM FOLLOW UP SONOGRAM Zinnia Gubbels is in the office for a follow up sonogram for BPP and cord dopplers. She is a 33 y.o. year old [id number] with Estimated Date of Delivery: 07/13/24 by early ultrasound now at  [redacted]w[redacted]d weeks gestation. Thus far the pregnancy  has been complicated by [facility]. GESTATION: SINGLETON PRESENTATION: cephalic FETAL ACTIVITY:          Heart rate         138          The fetus is active. AMNIOTIC FLUID: The amniotic fluid volume is  normal, 16 cm. PLACENTA LOCALIZATION:  posterior GRADE 2 CERVIX: Limited view GESTATIONAL AGE AND  BIOMETRICS: Gestational criteria: Estimated Date of Delivery: 07/13/24 by LMP now at [redacted]w[redacted]d Previous Scans:8 BIOPHYSICAL PROFILE:                                                                                                      COMMENTS GROSS BODY MOVEMENT                 2  TONE                2  RESPIRATIONS                2  AMNIOTIC FLUID                2                                                          SCORE:  8/8 (Note: NST was not performed as part of this antepartum testing)  DOPPLER FLOW STUDIES: UMBILICAL ARTERY RI RATIOS: .65,.71,.65=79% ANATOMICAL SURVEY                                                                            COMMENTS CEREBRAL VENTRICLES yes normal  CHOROID PLEXUS yes normal  CEREBELLUM yes normal  CISTERNA MAGNA  Yes  normal   CAVUM SEPTI PELLUCIDI YES NORMAL  NOSE/LIP yes normal  FACIAL PROFILE yes normal  4 CHAMBERED HEART yes normal  OUTFLOW TRACTS YES normaL  3VV YES NORMAL  3VTV YES NORMAL  SITUS YES NORMAL      DIAPHRAGM yes normal  STOMACH yes normal  RENAL REGION yes normal  BLADDER yes normal          3 VESSEL CORD yes normal              GENITALIA yes normal female     SUSPECTED ABNORMALITIES:  no QUALITY OF SCAN: satisfactory TECHNICIAN COMMENTS: US  35 wks,cephalic,FHR 138 bpm,AFI 16 cm,RI .65,.71,.65=79%,BPP 8/8,posterior placenta gr 2 A copy of this report including all images has been saved and backed up to a second source for retrieval if needed. All measures and details of the anatomical scan, placentation, fluid volume and pelvic anatomy are contained in that report. Amber JINNY Pitts 06/08/2024 2:10 PM Clinical Impression and recommendations: I have reviewed the  sonogram results above, combined with the patient's current clinical course, below are my impressions and any appropriate recommendations for management based on the sonographic findings. 1.  [id number] Estimated Date of Delivery: 07/13/24 by serial sonographic evaluations 2.  Fetal sonographic surveillance findings: a). Normal fluid volume b). Normal antepartum fetal assessment with BPP 8/8 c). Normal fetal Doppler ratios with consistent diastolic flow:  79% 3.  Normal general sonographic findings Recommend continued prenatal evaluations and care based on this sonogram and as clinically indicated from the patient's clinical course. Delon CHRISTELLA Prude 06/11/2024 9:03 PM     MAU Management/MDM: Orders Placed This Encounter  Procedures   Resp panel by RT-PCR (RSV, Flu A&B, Covid) Anterior Nasal Swab   Culture, OB Urine   Wet prep, genital   DG Chest Port 1 View   Urinalysis, Routine w reflex microscopic -Urine, Clean Catch   CBC with Differential   Comprehensive metabolic panel   Protime-INR   APTT   Brain natriuretic peptide   Protein / creatinine ratio, urine   Apply Sepsis Care Plan   Vital signs   Vital signs   Code Sepsis activation.  This occurs automatically when order is signed and prioritizes pharmacy, lab, and radiology services for STAT collections and interventions.  If [facility] downtime, call [facility] [phone]) to activate Code Sepsis.   gentamicin  per pharmacy consult   POCT fern test   POCT fern test   Type and screen [facility]    Meds ordered this encounter  Medications   lactated ringers  infusion   AND Linked Order Group    lactated ringers  bolus 1,000 mL     Total Body Weight basis for 30 mL/kg  bolus delivery:   73.9 kg    lactated ringers  bolus 1,000 mL     Total Body Weight basis for 30 mL/kg  bolus delivery:   73.9 kg    lactated ringers  bolus 250 mL     Total Body Weight basis for 30 mL/kg  bolus delivery:   73.9 kg   DISCONTD: cefTRIAXone  (ROCEPHIN )  2 g in sodium chloride  0.9 % 100 mL IVPB    Antibiotic Indication::   Intra-abdominal   DISCONTD: metroNIDAZOLE  (FLAGYL ) IVPB 500 mg    Antibiotic Indication::   Intra-abdominal Infection   ampicillin  (OMNIPEN) 2 g in sodium chloride  0.9 % 100 mL IVPB    Antibiotic Indication::   Other Indication (list below)    Other Indication::   Possible chorio - sepsis   gentamicin  (GARAMYCIN ) 310 mg in dextrose   5 % 100 mL IVPB    Antibiotic Indication::   Chorioamnionitis   DISCONTD: calcium  carbonate (TUMS - dosed in mg elemental calcium ) chewable tablet 200 mg of elemental calcium    calcium  carbonate (TUMS - dosed in mg elemental calcium ) chewable tablet 400 mg of elemental calcium    acetaminophen  (TYLENOL ) tablet 1,000 mg     Available prenatal records reviewed.  Patient presents with possible ROM, fever at [redacted]w[redacted]d.  Met septic criteria at presentation with fever, tachycardia, unknown source of infection.  Quickly assessed patient at the bedside given acuity of illness.  #Sepsis: Initially source of infection not obvious.  Started sepsis protocol.  Obtained CBC, CMP, lactate, EKG, chest x-ray, blood cultures, fern, viral swab for evaluation.  Started on sepsis fluid bolus with LR.  Given suspicion for chorioamnionitis, started on ampicillin  and gentamicin  for antibiotics and given Tylenol  for fever.  Once workup returned, sepsis confirmed with WBC 21.2.  Chest x-ray unremarkable.  Urine showed large leukocytes, 11-20 WBCs, rare bacteria.  On pelvic exam, pooling present.  On cervical exam, notable warmth present.  Ultimately suspect patient has chorioamnionitis versus possible pyelonephritis.  Will be continued on ampicillin  and gentamicin  for coverage.  Urine culture sent as well.  #AKI: Creatinine 1.02.  Fluid bolus given.  #Preterm labor: Cervix now dilated to 5 cm from 3 cm yesterday.  #cHTN: Meeting criteria for superimposed preeclampsia with elevated PC ratio.  Does also have an elevated  creatinine and headache.  At this time suspect the symptoms are due to sepsis versus severe features, however we will continue to monitor closely and have low threshold to start magnesium.  Will hold home labetalol  given current sepsis.  ASSESSMENT 1. Chorioamnionitis in third trimester, single or unspecified fetus   2. Sepsis without acute organ dysfunction, due to unspecified organism (HCC)   3. Chronic hypertension   4. Recurrent genital HSV (herpes simplex virus) infection   5. 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  Almarie Moats, MD OB Fellow 06/20/2024  2:58 AM

## 2024-06-20 NOTE — Lactation Note (Signed)
 This note was copied from a baby's chart. Lactation Consultation Note  Patient Name: Brooke Briggs Date: 06/20/2024 Age:33 hours Reason for consult: Initial assessment;Late-preterm 34-36.6wks. See MOB: MR- CHTN P4, LPTI  Per MOB, she breastfeed infant for 10 minutes and infant consumed 5 mls of 20 kcal formula prior to [facility] entering the room. LC did not observe the latch. Per MOB, she feels she has colostrum and would like to pump and give infant as much of her milk as possible. LC set MOB up with the DEBP and MOB expressed 15 mls . LC discussed LPTI feeding guidelines due infant being born at 10w5day. MOB briefly breastfeed previous children see maternal data below. MGM was giving infant 10 mls of EBM that MOB pumped using a [place] nipple as LC left the room. LC gave MOB crib card 'MY Care Plan MOB knows on Day 1 to supplement infant immediately after latching infant to breast with 14 mls per feeding. LC discussed the importance of maternal rest, meals and hydration. MOB was made aware of O/P services, breastfeeding support groups, community resources, and our phone # for post-discharge questions.    MOB understands that her EBM is safe for 4 hours at room temperature where as  RTF formula once open only safe for 1 hour.   Day 1 feeding plan: LPTI/LBW 1- MOB will  continue to feed infant every 3 hours and limit breast and bottle feeding to 30 minutes or less. 2- MOB will continue to supplement infant with any EBM first and then formula ( Day 1- 14 mls) or more per feeding. 3- MOB will continue to use the DEBP every 3 hours for 15 minutes on initial setting.  Maternal Data Has patient been taught Hand Expression?: Yes Does the patient have breastfeeding experience prior to this delivery?: Yes How long did the patient breastfeed?: Per MOB, she breifly breastfeed her 1st child for 4 months, 2nd child 4 months and 3rd child for 2 months . Per MOB tow of her children were born at [redacted] weeks  gestation.  Feeding Mother's Current Feeding Choice: Breast Milk and Formula  LATCH Score  LC did not observe latch was 1 hour prior to [facility] entering the room.                   Lactation Tools Discussed/Used Tools: Flanges;Pump Flange Size: 21;24 Breast pump type: Double-Electric Breast Pump Pump Education: Setup, frequency, and cleaning;Milk Storage Reason for Pumping: Infant is LPTI, MOB wants to give infant as much of her EBM as possible Pumping frequency: MOB will continue to pump every 3 hours for 15 minutes on inital setting. Pumped volume: 15 mL  Interventions    Discharge Pump: DEBP;Personal  Consult Status Consult Status: Follow-up Date: 06/21/24 Follow-up type: In-patient    [name] 06/20/2024, 6:15 PM

## 2024-06-20 NOTE — Consult Note (Signed)
 ANTIBIOTIC CONSULT NOTE - INITIAL  Pharmacy Consult for Gentamicin  Indication: Chorioamnionitis   Allergies  Allergen Reactions   Dilaudid [Hydromorphone Hcl] Nausea And Vomiting   Hydromorphone Other (See Comments)    Patient Measurements: Height: 5' 4 (162.6 cm) Weight: 73.9 kg (163 lb) IBW/kg (Calculated) : 54.7 Adjusted Body Weight: 62.4 kg  Vital Signs: Temp: 102.2 F (39 C) (08/12 0135) Temp Source: Oral (08/12 0135) BP: 105/39 (08/12 0135) Pulse Rate: 123 (08/12 0135)  Labs: Recent Labs    06/19/24 2300  WBC 21.2*  HGB 10.8*  PLT 199  CREATININE 1.02*   No results for input(s): GENTTROUGH, GENTPEAK, GENTRANDOM in the last 72 hours.   Microbiology: Recent Results (from the past 720 hours)  Urine Culture     Status: None   Collection Time: 06/08/24  4:39 PM   Specimen: Urine   UR  Result Value Ref Range Status   Urine Culture, Routine Final report  Final   Organism ID, Bacteria Comment  Final    Comment: Mixed urogenital flora 25,000-50,000 colony forming units per mL   Culture, beta strep (group b only)     Status: None   Collection Time: 06/15/24  2:35 PM   Specimen: Vaginal/Rectal; Genital   VR  Result Value Ref Range Status   Strep Gp B Culture Negative Negative Final    Comment: Centers for Disease Control and Prevention (CDC) and American Congress of Obstetricians and Gynecologists (ACOG) guidelines for prevention of perinatal group B streptococcal (GBS) disease specify co-collection of a vaginal and rectal swab specimen to maximize sensitivity of GBS detection. Per the CDC and ACOG, swabbing both the lower vagina and rectum substantially increases the yield of detection compared with sampling the vagina alone. Penicillin  G, ampicillin , or cefazolin  are indicated for intrapartum prophylaxis of perinatal GBS colonization. Reflex susceptibility testing should be performed prior to use of clindamycin only on GBS isolates from  penicillin -allergic women who are considered a high risk for anaphylaxis. Treatment with vancomycin without additional testing is warranted if resistance to clindamycin is noted.   Resp panel by RT-PCR (RSV, Flu A&B, Covid) Anterior Nasal Swab     Status: None   Collection Time: 06/19/24 11:27 PM   Specimen: Anterior Nasal Swab  Result Value Ref Range Status   SARS Coronavirus 2 by RT PCR NEGATIVE NEGATIVE Final   Influenza A by PCR NEGATIVE NEGATIVE Final   Influenza B by PCR NEGATIVE NEGATIVE Final    Comment: (NOTE) The Xpert Xpress SARS-CoV-2/FLU/RSV plus assay is intended as an aid in the diagnosis of influenza from Nasopharyngeal swab specimens and should not be used as a sole basis for treatment. Nasal washings and aspirates are unacceptable for Xpert Xpress SARS-CoV-2/FLU/RSV testing.  Fact Sheet for Patients: BloggerCourse.com  Fact Sheet for Healthcare Providers: SeriousBroker.it  This test is not yet approved or cleared by the [place]  FDA and has been authorized for detection and/or diagnosis of SARS-CoV-2 by FDA under an Emergency Use Authorization (EUA). This EUA will remain in effect (meaning this test can be used) for the duration of the COVID-19 declaration under Section 564(b)(1) of the Act, 21 U.S.C. section 360bbb-3(b)(1), unless the authorization is terminated or revoked.     Resp Syncytial Virus by PCR NEGATIVE NEGATIVE Final    Comment: (NOTE) Fact Sheet for Patients: BloggerCourse.com  Fact Sheet for Healthcare Providers: SeriousBroker.it  This test is not yet approved or cleared by the [place]  FDA and has been authorized for detection and/or diagnosis of  SARS-CoV-2 by FDA under an Emergency Use Authorization (EUA). This EUA will remain in effect (meaning this test can be used) for the duration of the COVID-19 declaration under Section  564(b)(1) of the Act, 21 U.S.C. section 360bbb-3(b)(1), unless the authorization is terminated or revoked.  Performed at [facility], [address] [place], [place] [address]     Medications:  Ampicillin  2gm q6h  Assessment: 33 y.o. female [phone] at [redacted]w[redacted]d presented for ROM and contractions as well as headache, chills, and body aches overnight. She has a temp of 101.97F so antibiotics were started for possible chorio   Plan:  Gentamicin  310 mg IV every 24 hrs   Check Scr with next labs if gentamicin  continued. Will check gentamicin  levels if continued > 72hr or clinically indicated.  Powell GORMAN Sick 06/20/2024,2:49 AM

## 2024-06-20 NOTE — Discharge Summary (Signed)
 Postpartum Discharge Summary     Patient Name: Brooke Briggs DOB: August 25, 1991 MRN: [id number]  Date of admission: 06/19/2024 Delivery date:06/20/2024 Delivering provider: ILEAN NORLEEN GAILS Date of discharge: 06/22/2024  Admitting diagnosis: Chronic hypertension affecting pregnancy [O10.919] Intrauterine pregnancy: [redacted]w[redacted]d     Secondary diagnosis:  Principal Problem:   Chronic hypertension affecting pregnancy Active Problems:   Chronic hypertension   Sepsis (HCC)   Chronic hypertension with superimposed preeclampsia   Elevated serum creatinine   Anemia associated with acute blood loss  Additional problems: None    Discharge diagnosis: Term Pregnancy Delivered and CHTN with superimposed preeclampsia                                              Post partum procedures:None Augmentation: AROM and Pitocin  Complications: None  Hospital course: Induction of Labor With Vaginal Delivery   33 y.o. yo [id number] at [redacted]w[redacted]d was admitted to the hospital 06/19/2024 for induction of labor.  Indication for induction: Patient was meeting sepsis criteria on arrival, possible chorioamnionitis with prolonged rupture of membranes.  Patient had an labor course complicated by chronic hypertension with possible superimposed preeclampsia versus sepsis.  Patient had a severe headache but resolved with Tylenol .  Patient had a fever up to 103 F which resolved with Tylenol . Membrane Rupture Time/Date: 8:30 PM,06/19/2024  Delivery Method:Vaginal, Spontaneous Operative Delivery:N/A Episiotomy: None Lacerations:  None Details of delivery can be found in separate delivery note. Pt switched to Procardia  for once daily dosing. BPs still mildly elevated. Increased to 60 mg QD. Patient had a postpartum course complicated by mildly elevated creatinine. When rechecked on postpartum day 2 ~48 hours PP it had risen slightly to 1.13 which meets criteria for a severe feature of Pre-E. Discussed with Dr. Eldonna who reviewed Hx  including previous Cr. Has low concern for seizure due to being >48 hours postpartum and due to very small rise in creatinine from her baseline. Discussed labs, Dx and that Magnesium Sulfate is indicated for seizure prophylaxis in Pre-eclampsia w/ severe features. Pt's questions were addressed and she declines Magnesium Sulfate. Pre-E precautions reviewed. Pt encouraged to return to MAU for worsening Sx. BP check scheduled 06/27/24.    Pt's fevers resolved. Clinically well.  Switched to PO Cefadroxil  per sensitivities. Plan 12 more days ABX.   Clinically significant acute blood loss anemia treated with oral iron.   Patient is discharged home 06/22/24.  Newborn Data: Birth date:06/20/2024 Birth time:12:04 PM Gender:Female Living status:Living Apgars:6 ,6  Weight:3030 g  Magnesium Sulfate received: No BMZ received: No Rhophylac:N/A MMR:N/A T-DaP:Given prenatally Flu: No RSV Vaccine received: No Transfusion:No  Immunizations received: Immunization History  Administered Date(s) Administered   HPV Quadrivalent 01/13/2013   Influenza Split 10/21/2011   Influenza, Seasonal, Injecte, Preservative Fre 10/24/2015   Influenza,inj,Quad PF,6+ Mos 08/15/2018, 09/11/2021   PFIZER(Purple Top)SARS-COV-2 Vaccination 10/02/2020, 10/23/2020   Tdap 11/21/2015, 08/15/2018, 09/11/2021, 04/19/2024    Physical exam  Vitals:   06/22/24 0640 06/22/24 0925 06/22/24 1112 06/22/24 1405  BP: 119/81 (!) 151/98 (!) 137/91 (!) 144/96  Pulse: 85 72 81 77  Resp: 18  18 20   Temp: 97.7 F (36.5 C)  97.7 F (36.5 C) 97.9 F (36.6 C)  TempSrc: Oral  Oral Oral  SpO2: 98%  100% 100%  Weight:      Height:       General: alert,  cooperative, and no distress Lochia: appropriate Uterine Fundus: firm Incision: N/A DVT Evaluation: No evidence of DVT seen on physical exam. Labs: Lab Results  Component Value Date   WBC 9.6 06/21/2024   HGB 8.7 (L) 06/21/2024   HCT 26.0 (L) 06/21/2024   MCV 84.7 06/21/2024    PLT 240 06/21/2024      Latest Ref Rng & Units 06/22/2024    1:29 PM  CMP  Glucose 70 - 99 mg/dL 73   BUN 6 - 20 mg/dL 11   Creatinine 9.55 - 1.00 mg/dL 8.86   Sodium 864 - 854 mmol/L 138   Potassium 3.5 - 5.1 mmol/L 3.8   Chloride 98 - 111 mmol/L 103   CO2 22 - 32 mmol/L 24   Calcium  8.9 - 10.3 mg/dL 9.0   Total Protein 6.5 - 8.1 g/dL 5.8   Total Bilirubin 0.0 - 1.2 mg/dL 0.6   Alkaline Phos 38 - 126 U/L 127   AST 15 - 41 U/L 24   ALT 0 - 44 U/L 21    Edinburgh Score:    06/21/2024    9:45 AM  Edinburgh Postnatal Depression Scale Screening Tool  I have been able to laugh and see the funny side of things. 0  I have looked forward with enjoyment to things. 0  I have blamed myself unnecessarily when things went wrong. 0  I have been anxious or worried for no good reason. 0  I have felt scared or panicky for no good reason. 0  Things have been getting on top of me. 0  I have been so unhappy that I have had difficulty sleeping. 0  I have felt sad or miserable. 0  I have been so unhappy that I have been crying. 0  The thought of harming myself has occurred to me. 0  Edinburgh Postnatal Depression Scale Total 0   Edinburgh Postnatal Depression Scale Total: 0   After visit meds:  Allergies as of 06/22/2024       Reactions   Dilaudid [hydromorphone Hcl] Nausea And Vomiting   Hydromorphone Other (See Comments)        Medication List     STOP taking these medications    aspirin  EC 81 MG tablet   labetalol  200 MG tablet Commonly known as: NORMODYNE    valACYclovir  500 MG tablet Commonly known as: VALTREX        TAKE these medications    Acetaminophen  Extra Strength 500 MG Tabs Commonly known as: TYLENOL  Take 2 tablets (1,000 mg total) by mouth every 6 (six) hours as needed (for pain scale < 4).   busPIRone  10 MG tablet Commonly known as: BUSPAR  Take 1 tablet (10 mg total) by mouth 3 (three) times daily for 10 days.   cefadroxil  500 MG capsule Commonly  known as: DURICEF Take 2 capsules (1,000 mg total) by mouth 2 (two) times daily for 12 days.   FeroSul 325 (65 Fe) MG tablet Generic drug: ferrous sulfate  Take 1 tablet (325 mg total) by mouth every other day.   furosemide  20 MG tablet Commonly known as: LASIX  Take 1 tablet (20 mg total) by mouth daily. Start taking on: June 23, 2024   ibuprofen  600 MG tablet Commonly known as: ADVIL  Take 1 tablet (600 mg total) by mouth every 6 (six) hours as needed.   NIFEdipine  30 MG 24 hr tablet Commonly known as: ADALAT  CC Take 2 tablets (60 mg total) by mouth daily. Start taking on: June 23, 2024  potassium chloride  SA 20 MEQ tablet Commonly known as: KLOR-CON  M Take 1 tablet (20 mEq total) by mouth daily. Start taking on: June 23, 2024   sertraline  50 MG tablet Commonly known as: ZOLOFT  Take 1 tablet (50 mg total) by mouth daily.         Discharge home in stable condition Infant Feeding: Bottle and Breast Infant Disposition:NICU Discharge instruction: per After Visit Summary and Postpartum booklet. Activity: Advance as tolerated. Pelvic rest for 6 weeks.  Diet: iron rich diet Future Appointments: Future Appointments  Date Time Provider Department Center  06/27/2024 10:50 AM [facility] NURSE [facility]  08/01/2024 10:30 AM Kizzie Suzen SAUNDERS, CNM [facility]   Follow up Visit:  Follow-up Information     [facility] Follow up.   Specialty: Obstetrics and Gynecology Why: blood pressure check 06/27/24 Postpartum visit in 6 weeks Contact information: [address] [place]  [address] [phone]        [facility] Follow up.   Specialty: Obstetrics and Gynecology Why: As needed in emergencies including symptoms of preeclampsia Contact information: [address] [place]  [phone]                 Please schedule this  patient for a In person postpartum visit in 4 weeks with the following provider: Any provider. Additional Postpartum F/U:BP check 1 week  High risk pregnancy complicated by: HTN Delivery mode:  Vaginal, Spontaneous Anticipated Birth Control:  Unsure   06/22/2024 Brooke Briggs  Brooke Briggs, CNM

## 2024-06-21 LAB — CBC WITH DIFFERENTIAL/PLATELET
Abs Immature Granulocytes: 0.21 K/uL — ABNORMAL HIGH (ref 0.00–0.07)
Basophils Absolute: 0 K/uL (ref 0.0–0.1)
Basophils Relative: 0 %
Eosinophils Absolute: 0.1 K/uL (ref 0.0–0.5)
Eosinophils Relative: 1 %
HCT: 26 % — ABNORMAL LOW (ref 36.0–46.0)
Hemoglobin: 8.7 g/dL — ABNORMAL LOW (ref 12.0–15.0)
Immature Granulocytes: 2 %
Lymphocytes Relative: 14 %
Lymphs Abs: 1.3 K/uL (ref 0.7–4.0)
MCH: 28.3 pg (ref 26.0–34.0)
MCHC: 33.5 g/dL (ref 30.0–36.0)
MCV: 84.7 fL (ref 80.0–100.0)
Monocytes Absolute: 1.3 K/uL — ABNORMAL HIGH (ref 0.1–1.0)
Monocytes Relative: 14 %
Neutro Abs: 6.7 K/uL (ref 1.7–7.7)
Neutrophils Relative %: 69 %
Platelets: 240 K/uL (ref 150–400)
RBC: 3.07 MIL/uL — ABNORMAL LOW (ref 3.87–5.11)
RDW: 14.4 % (ref 11.5–15.5)
WBC: 9.6 K/uL (ref 4.0–10.5)
nRBC: 0 % (ref 0.0–0.2)

## 2024-06-21 MED ORDER — NIFEDIPINE ER OSMOTIC RELEASE 30 MG PO TB24
30.0000 mg | ORAL_TABLET | Freq: Every day | ORAL | Status: DC
  Administered 2024-06-21 – 2024-06-22 (×3): 30 mg via ORAL
  Filled 2024-06-21 (×2): qty 1

## 2024-06-21 MED ORDER — FERROUS SULFATE 325 (65 FE) MG PO TABS
325.0000 mg | ORAL_TABLET | Freq: Every day | ORAL | Status: DC
  Administered 2024-06-22: 325 mg via ORAL
  Filled 2024-06-21: qty 1

## 2024-06-21 MED ORDER — SODIUM CHLORIDE 0.9 % IV SOLN
INTRAVENOUS | Status: AC | PRN
  Administered 2024-06-21 (×2): 10 mL via INTRAVENOUS

## 2024-06-21 NOTE — Progress Notes (Signed)
 Post Partum Day 1 Subjective: up ad lib, voiding, and tolerating PO.  Few episodes of feeling cold, shaky and breaking out in a sweat.  Feeling well now.  No significant pain. RN informed CNM the there were two episodes (one last night and one this afternoon) where temp would not register. Recheck soon after was low 96.1.   Baby in NICU for hypothermia and hypoglycemia in respiratory issues.  Objective: Blood pressure 120/88, pulse 66, temperature (!) 97.5 F (36.4 C), temperature source Oral, resp. rate 18, height 5' 4 (1.626 m), weight 73.9 kg, SpO2 100%, unknown if currently breastfeeding. Patient Vitals for the past 24 hrs:  BP Temp Temp src Pulse Resp SpO2  06/21/24 2000 -- (!) 97.5 F (36.4 C) Oral -- -- --  06/21/24 1821 -- (!) 96.1 F (35.6 C) Axillary -- -- --  06/21/24 1424 120/88 98 F (36.7 C) Oral 66 18 --  06/21/24 0556 120/79 -- -- (!) 58 18 100 %  06/21/24 0236 107/80 98.1 F (36.7 C) Oral 69 16 100 %  06/20/24 2209 (!) 136/95 -- -- 87 18 100 %  06/20/24 2202 -- 98.1 F (36.7 C) Oral -- -- --     Physical Exam:  General: alert, cooperative, appears stated age, no distress, and nontoxic appearing Lochia: appropriate Uterine Fundus: firm Incision: N/A DVT Evaluation: No evidence of DVT seen on physical exam.      Latest Ref Rng & Units 06/21/2024    7:39 PM 06/20/2024    9:43 AM 06/19/2024   11:00 PM  CBC  WBC 4.0 - 10.5 K/uL 9.6  18.8  21.2   Hemoglobin 12.0 - 15.0 g/dL 8.7  9.7  89.1   Hematocrit 36.0 - 46.0 % 26.0  28.8  32.9   Platelets 150 - 400 K/uL 240  219  199       Latest Ref Rng & Units 06/19/2024   11:00 PM 06/08/2024    4:42 PM 01/10/2024    2:44 PM  CMP  Glucose 70 - 99 mg/dL 75  72  57   BUN 6 - 20 mg/dL <5  <5  6   Creatinine 0.44 - 1.00 mg/dL 8.97  9.16  9.29   Sodium 135 - 145 mmol/L 131  134  133   Potassium 3.5 - 5.1 mmol/L 3.4  3.9  3.9   Chloride 98 - 111 mmol/L 98  101  99   CO2 22 - 32 mmol/L 21  22  22    Calcium  8.9 - 10.3  mg/dL 8.8  8.6  9.3   Total Protein 6.5 - 8.1 g/dL 7.0  6.3  6.7   Total Bilirubin 0.0 - 1.2 mg/dL 1.3  0.6  <9.7   Alkaline Phos 38 - 126 U/L 179  130  76   AST 15 - 41 U/L 23  17  9    ALT 0 - 44 U/L 16  12  11       Assessment/Plan: Bacteremic: Last fever 06/20/24 at 0258. Now having low temps. Clinically well.   - Plan for discharge when temps Nml x 24 hours.  - Still awaiting sensitivities to blood cultures. Rocephin  appropriate based on previous sensitivities to Klebsiella urine culture.    Chronic hypertension.  BP stable -Switch from labetalol  to Procardia  for once a day dosing.  Has been on Procardia  before and tolerated well. - Lasix  x 5 days - Creatinine previously mildly elevated->now normal.  Breastfeeding -Lactation consult  Circumcision prior to discharge  Contraception  - OP Mirena   Anemia - PO iron   LOS: 1 day   Brooke Briggs  Brooke Briggs 06/21/2024, 9:24 PM

## 2024-06-22 ENCOUNTER — Encounter (HOSPITAL_COMMUNITY): Payer: Self-pay | Admitting: Obstetrics & Gynecology

## 2024-06-22 ENCOUNTER — Encounter: Admitting: Obstetrics & Gynecology

## 2024-06-22 ENCOUNTER — Other Ambulatory Visit (HOSPITAL_COMMUNITY): Payer: Self-pay

## 2024-06-22 ENCOUNTER — Other Ambulatory Visit

## 2024-06-22 DIAGNOSIS — D62 Acute posthemorrhagic anemia: Secondary | ICD-10-CM | POA: Diagnosis not present

## 2024-06-22 DIAGNOSIS — R7989 Other specified abnormal findings of blood chemistry: Secondary | ICD-10-CM | POA: Diagnosis present

## 2024-06-22 DIAGNOSIS — O119 Pre-existing hypertension with pre-eclampsia, unspecified trimester: Secondary | ICD-10-CM | POA: Diagnosis present

## 2024-06-22 DIAGNOSIS — A419 Sepsis, unspecified organism: Secondary | ICD-10-CM | POA: Diagnosis present

## 2024-06-22 LAB — CULTURE, BLOOD (ROUTINE X 2)

## 2024-06-22 LAB — COMPREHENSIVE METABOLIC PANEL WITH GFR
ALT: 21 U/L (ref 0–44)
AST: 24 U/L (ref 15–41)
Albumin: 2.1 g/dL — ABNORMAL LOW (ref 3.5–5.0)
Alkaline Phosphatase: 127 U/L — ABNORMAL HIGH (ref 38–126)
Anion gap: 11 (ref 5–15)
BUN: 11 mg/dL (ref 6–20)
CO2: 24 mmol/L (ref 22–32)
Calcium: 9 mg/dL (ref 8.9–10.3)
Chloride: 103 mmol/L (ref 98–111)
Creatinine, Ser: 1.13 mg/dL — ABNORMAL HIGH (ref 0.44–1.00)
GFR, Estimated: >60 mL/min (ref >60)
Glucose, Bld: 73 mg/dL (ref 70–99)
Potassium: 3.8 mmol/L (ref 3.5–5.1)
Sodium: 138 mmol/L (ref 135–145)
Total Bilirubin: 0.6 mg/dL (ref 0.0–1.2)
Total Protein: 5.8 g/dL — ABNORMAL LOW (ref 6.5–8.1)

## 2024-06-22 LAB — CULTURE, OB URINE: Culture: >=100000 — AB

## 2024-06-22 LAB — SURGICAL PATHOLOGY

## 2024-06-22 MED ORDER — SERTRALINE HCL 50 MG PO TABS
50.0000 mg | ORAL_TABLET | Freq: Every day | ORAL | 1 refills | Status: AC
  Filled 2024-06-22: qty 30, 30d supply, fill #0

## 2024-06-22 MED ORDER — NIFEDIPINE ER 30 MG PO TB24
60.0000 mg | ORAL_TABLET | Freq: Every day | ORAL | 0 refills | Status: AC
  Filled 2024-06-22: qty 60, 30d supply, fill #0

## 2024-06-22 MED ORDER — FUROSEMIDE 20 MG PO TABS
20.0000 mg | ORAL_TABLET | Freq: Every day | ORAL | 0 refills | Status: AC
  Filled 2024-06-22: qty 4, 4d supply, fill #0

## 2024-06-22 MED ORDER — POTASSIUM CHLORIDE CRYS ER 20 MEQ PO TBCR
20.0000 meq | EXTENDED_RELEASE_TABLET | Freq: Every day | ORAL | 0 refills | Status: AC
  Filled 2024-06-22: qty 4, 4d supply, fill #0

## 2024-06-22 MED ORDER — BUSPIRONE HCL 10 MG PO TABS
10.0000 mg | ORAL_TABLET | Freq: Three times a day (TID) | ORAL | 0 refills | Status: AC
  Filled 2024-06-22: qty 30, 10d supply, fill #0

## 2024-06-22 MED ORDER — POTASSIUM CHLORIDE CRYS ER 20 MEQ PO TBCR
20.0000 meq | EXTENDED_RELEASE_TABLET | Freq: Every day | ORAL | Status: DC
  Administered 2024-06-22: 20 meq via ORAL
  Filled 2024-06-22: qty 1

## 2024-06-22 MED ORDER — IBUPROFEN 600 MG PO TABS
600.0000 mg | ORAL_TABLET | Freq: Four times a day (QID) | ORAL | 0 refills | Status: AC | PRN
  Filled 2024-06-22: qty 30, 8d supply, fill #0

## 2024-06-22 MED ORDER — FERROUS SULFATE 325 (65 FE) MG PO TABS
325.0000 mg | ORAL_TABLET | ORAL | 0 refills | Status: AC
  Filled 2024-06-22: qty 15, 30d supply, fill #0

## 2024-06-22 MED ORDER — ACETAMINOPHEN 500 MG PO TABS
1000.0000 mg | ORAL_TABLET | Freq: Four times a day (QID) | ORAL | 0 refills | Status: AC | PRN
  Filled 2024-06-22: qty 30, 4d supply, fill #0

## 2024-06-22 MED ORDER — CEFADROXIL 500 MG PO CAPS
1000.0000 mg | ORAL_CAPSULE | Freq: Two times a day (BID) | ORAL | Status: DC
  Administered 2024-06-22: 1000 mg via ORAL
  Filled 2024-06-22 (×2): qty 2

## 2024-06-22 MED ORDER — CEFADROXIL 500 MG PO CAPS
1000.0000 mg | ORAL_CAPSULE | Freq: Two times a day (BID) | ORAL | 0 refills | Status: AC
  Filled 2024-06-22: qty 48, 12d supply, fill #0

## 2024-06-22 NOTE — Lactation Note (Signed)
 This note was copied from a baby's chart.  NICU Lactation Consultation Note  Patient Name: Boy Meighan Heckman Unijb'd Date: 06/22/2024 Age:33 hours  Reason for consult: Follow-up assessment; NICU baby; Other (Comment); Maternal discharge; Early term 4-38.6wks (cHTN, Pre-E, maternal Hx of THC use)  SUBJECTIVE Visited with family of 23 26/3 weeks old AGA NICU female; baby Evangeline was admitted due to suspected sepsis, hypoglycemia and hypothermia.  Ms. Krupka is a P4 and has some experience breastfeeding her other babies. She voiced she didn't have a chance to pump today as it was her discharge day and was a busy one. However she pumped yesterday and is only getting droplets of colostrum. Re-educated about the importance of consistent pumping early on being mainly for breast stimulation and not to get volume. She was trying to feed baby Jasai a bottle when entered the room, noticed baby was sleepy and not drinking. Educated MOB on pace feeding and asked her to only attempt feeding on feeding cues when baby is awake and alert; he had a medium spit up of just formula. Reviewed discharge education, lactogenesis II, pumping schedule, pump settings and anticipatory guidelines. She has the inserts she needs for her Motif pump at home.   OBJECTIVE Infant data: Mother's Current Feeding Choice: Breast Milk and Formula  O2 Device: Room Air  Infant feeding assessment IDFTS - Readiness: 3 IDFTS - Quality: 4   Maternal data: [id number] Vaginal, Spontaneous Current breast feeding challenges:: NICU admission Pumping frequency: Did not pump today, pumped 3 times/yesterday Pumped volume: -- (droplets, not enough to collect per MOB) Risk factor for low/delayed milk supply:: prematurity, infant separation, PPH of 598 cc  Pump: Personal, Hands Free (Motif wearable pump at home)  ASSESSMENT Infant: Feeding Status: Scheduled 9-12-3-6 Feeding method: Bottle; Tube/Gavage (Bolus) Nipple Type: Dr. Jonna Fling  Preemie  Maternal: Milk volume: Normal  INTERVENTIONS/PLAN Interventions: Interventions: Breast feeding basics reviewed; DEBP; Education Discharge Education: Engorgement and breast care Tools: Pump  Plan: STS around care times Pump both breasts on initiate mode every 3 hours for 15 minutes, ideally 8 pumping sessions/24 hours Switch to maintain mode once expressing + 20 ml of EBM combined Bring all pump pieces to baby's room after her discharge Call out for latch assistance if needed  No other support person at this time. All questions and concerns answered, family to contact [facility] services PRN.  Consult Status: NICU follow-up NICU Follow-up type: Verify absence of engorgement; Verify onset of copious milk   Princella Jaskiewicz S Terrilynn Postell 06/22/2024, 6:43 PM

## 2024-06-22 NOTE — Clinical Social Work Maternal (Signed)
 CLINICAL SOCIAL WORK MATERNAL/CHILD NOTE  Patient Details  Name: Brooke Briggs MRN: [id number] Date of Birth: 06-21-1991  Date:  06/22/2024  Clinical Social Worker Initiating Note:  Nat Quiet, [place] Date/Time: Initiated:  06/22/24/1100     Child's Name:  Brooke Briggs   Biological Parents:  Mother, Father (MOB: Brooke Briggs 1991[birth year]20, FOB: Brooke Briggs 06/26/1994)   Need for Interpreter:  None   Reason for Referral:  Other (Comment), Current Substance Use/Substance Use During Pregnancy   (NICU admission)   Address:  [address] [place] [address]    Phone number:  [phone] (home)     Additional phone number:   Household Members/Support Persons (HM/SP):   Household Member/Support Person 1, Household Member/Support Person 2, Household Member/Support Person 3, Household Member/Support Person 4   HM/SP Name Relationship DOB or Age  HM/SP -1 Brooke Briggs Significant Other 06/26/1994  HM/SP -2 Brooke Briggs Son 8  HM/SP -3 Brooke Briggs Son 5  HM/SP -4 Brooke Briggs Son 2  HM/SP -5        HM/SP -6        HM/SP -7        HM/SP -8          Natural Supports (not living in the home):  Parent   Professional Supports: None   Employment: Full-time   Type of Work: Teacher, early years/pre   Education:  Some Materials engineer arranged:    Surveyor, quantity Resources:  [employer]   Other Resources:  [employer], Sales executive     Cultural/Religious Considerations Which May Impact Care:    Strengths:  Ability to meet basic needs  , Home prepared for child  , Psychotropic Medications, Pediatrician Brooke   Psychotropic Medications:  Zoloft       Pediatrician:    Sara Lee  Pediatrician List:   [facility] [place]    [facility]  [facility]      Pediatrician Fax Number:    Risk Factors/Current Problems:      Cognitive State:  Able to Concentrate  , Alert     Mood/Affect:  Calm  , Comfortable  , Interested  ,  Tearful     CSW Assessment: CSW received consult for NICU admission.  CSW met with MOB to offer support and complete assessment.   CSW met with MOB at bedside and introduced role. MOB appeared calm and was sitting upright in bed. MOB welcomed CSW visit and engaged with CSW. CSW congratulated MOB on baby boy, Jasai. CSW inquired if MOB's demographic information on hospital file was correct. MOB reported that her information on hospital file was correct. MOB reported that she lives with her significant other and three boys. She is employed at [employer] and receives [employer]. She reported that she has all essential items to care for her infant. CSW inquired MOB's support system. MOB identified her significant other and her mother as her primary supports. CSW asked MOB how she had been feeling. MOB expressed that she was doing okay and shared this was her first experience with having a baby in the NICU. She explained that hearing other babies crying on the unit made her feel sad, since she wishes that her own baby was in the room with her. CSW validated MOB's feelings. CSW asked MOB if she had been to the NICU to see her infant. MOB reported that she had been to the NICU and had  received an update on her baby's care. CSW discussed NICU support services and offered to check in with MOB to offer support while the infant remains in the NICU. MOB was receptive to CSW checking in with her.  CSW inquired about MOB's mental health history. MOB reported that she has anxiety, depression and PTSD. She denied mental health experiencing mental health concern during the pregnancy. MOB reported that she takes Zoloft  to manage her symptoms and will continue to take her mental health medication during the postpartum period. MOB denied experiencing postpartum depression/anxiety with her older children. CSW provided education regarding the baby blues period vs. perinatal mood disorders, discussed treatment and encouraged  treatment. CSW recommended MOB complete self-evaluation during the postpartum time period using the New Mom Checklist from Postpartum Progress and encouraged MOB to contact a medical professional if symptoms are noted at any time. CSW assessed MOB for safety. MOB denied having thoughts of harming herself and others. MOB denied concern with domestic violence.   CSW inquired about MOB's substance use during the pregnancy and noted that her urine drug screen completed on 06/18/24, resulted positive for THC. MOB reported that she used marijuana before her pregnancy to cope with PTSD symptoms and denied use during the pregnancy. MOB denied using any other substance during the pregnancy. CSW informed MOB about the hospital drug screen policy and that CSW will continue to monitor the infant's cord drug screen. CSW inquired if MOB had CPS involvement. MOB reported that she had never had CPS involvement. MOB became tearful. CSW offered encouragement and provided emotional support.  CSW inquired if MOB had Brooke a pediatrician for her infant. MOB reported [facility]. CSW assessed MOB for additional needs. MOB reported none.  CSW Will Continue to Monitor Umbilical Cord Tissue Drug Screen Results and Make Report if Warranted   CSW will continue to offer support and resources to family while infant remains in NICU.   CSW Plan/Description:  Perinatal Mood and Anxiety Disorder (PMADs) Education, Sudden Infant Death Syndrome (SIDS) Education, No Further Intervention Required/No Barriers to Discharge, Hospital Drug Screen Policy Information, Child Protective Service Report  , CSW Will Continue to Monitor Umbilical Cord Tissue Drug Screen Results and Make Report if Warranted    Nat DELENA Quiet, LCSW 06/22/2024, 11:37 AM

## 2024-06-22 NOTE — Progress Notes (Signed)
 B/P 151/98, pt asymptomatic. Given scheduled dose of Procardia  XL 30 mg. Notified Dr. Janita.

## 2024-06-22 NOTE — Lactation Note (Signed)
 This note was copied from a baby's chart. Lactation Consultation Note  Patient Name: Brooke Briggs Unijb'd Date: 06/22/2024 Age:33 hours  Infant currently in NICU, Per MOB,  she is using the DEBP and has 30 mls in fridge she plans to take to NICU will ask RN for milk labels. MOB will continue to use the DEBP every 3 hours for 15 minutes on initial setting. MOB will follow NICU infant feeding guidelines.    Maternal Data    Feeding    LATCH Score                    Lactation Tools Discussed/Used    Interventions    Discharge    Consult Status      [name] 06/22/2024, 1:29 AM

## 2024-06-22 NOTE — Progress Notes (Signed)
 Post Partum Day 2 Subjective: up ad lib, voiding, and tolerating PO.  Denies HA, vision changes or epigastric pain.   Objective: Blood pressure (!) 144/96, pulse 77, temperature 97.9 F (36.6 C), temperature source Oral, resp. rate 20, height 5' 4 (1.626 m), weight 73.9 kg, SpO2 100%, unknown if currently breastfeeding.  Physical Exam:  General: alert, cooperative, no distress, and non-toxic appearing. Tearful about baby being in NICU and being away from her other kids.  Lochia: appropriate Uterine Fundus: firm Incision: NA DVT Evaluation: No evidence of DVT seen on physical exam.  Recent Labs    06/20/24 0943 06/21/24 1939  HGB 9.7* 8.7*  HCT 28.8* 26.0*    Assessment/Plan: Bacteremic: Last fever 06/20/24 at 0258. No further high or low temps. Clinically well.   - Switch to Duraceph x 12 more days per sensitivities and consult with pharmacy.  - Still awaiting sensitivities to blood cultures. Rocephin  appropriate based on previous sensitivities to Klebsiella urine culture.    Chronic hypertension.  BP still mildly elevated. Increased Procardia  to 60 QD. -Switch from labetalol  to Procardia  for once a day dosing.  Has been on Procardia  before and tolerated well. - Lasix  x 5 days - Creatinine previously mildly elevated->repeat slightly higher at 1.13. 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 Reviewed with Dr. Eldonna who feels like pt is at low risk for seizures due to >48 hours PP and less concerned with what a small increase pt has had in baseline creatinine. Will discuss Labs and R/B/I Magnesium Sulfate x 24 hours for seizure prophylaxis w/ pt.    Breastfeeding -Lactation consult   Circumcision prior to discharge   Contraception  - OP Mirena    Anemia - PO iron   LOS: 2 days   Brooke Briggs  Brooke Briggs 06/22/2024, 4:30 PM

## 2024-06-23 ENCOUNTER — Inpatient Hospital Stay (HOSPITAL_COMMUNITY): Admission: RE | Admit: 2024-06-23 | Source: Home / Self Care | Admitting: Obstetrics & Gynecology

## 2024-06-23 ENCOUNTER — Inpatient Hospital Stay (HOSPITAL_COMMUNITY)

## 2024-06-24 LAB — CULTURE, BLOOD (ROUTINE X 2)

## 2024-06-26 ENCOUNTER — Other Ambulatory Visit

## 2024-06-27 ENCOUNTER — Telehealth

## 2024-06-29 ENCOUNTER — Encounter: Admitting: Women's Health

## 2024-06-29 ENCOUNTER — Other Ambulatory Visit

## 2024-07-03 ENCOUNTER — Other Ambulatory Visit

## 2024-07-03 ENCOUNTER — Telehealth (HOSPITAL_COMMUNITY): Payer: Self-pay | Admitting: *Deleted

## 2024-07-03 NOTE — Telephone Encounter (Signed)
 07/03/2024  Name: Kinza Gouveia MRN: [id number] DOB: [birth year]  Reason for Call:  Transition of Care Hospital Discharge Call  Contact Status: Patient Contact Status: Message  Language assistant needed:          Follow-Up Questions:    Van Postnatal Depression Scale:  In the Past 7 Days:    PHQ2-9 Depression Scale:     Discharge Follow-up:    Post-discharge interventions: NA  Mliss Sieve, RN 07/03/2024 11:11

## 2024-07-06 ENCOUNTER — Other Ambulatory Visit

## 2024-07-06 ENCOUNTER — Encounter: Admitting: Women's Health

## 2024-07-11 ENCOUNTER — Other Ambulatory Visit

## 2024-07-13 ENCOUNTER — Other Ambulatory Visit

## 2024-07-14 ENCOUNTER — Other Ambulatory Visit

## 2024-08-01 ENCOUNTER — Ambulatory Visit: Admitting: Women's Health

## 2024-08-15 ENCOUNTER — Other Ambulatory Visit: Payer: Self-pay | Admitting: Advanced Practice Midwife

## 2024-11-14 ENCOUNTER — Ambulatory Visit: Admitting: Women's Health
